# Patient Record
Sex: Female | Born: 1985
Health system: Southern US, Community
[De-identification: ages and names within clinical notes are randomized; demographics above are authoritative.]

## PROBLEM LIST (undated history)

## (undated) DIAGNOSIS — F329 Major depressive disorder, single episode, unspecified: Secondary | ICD-10-CM

## (undated) DIAGNOSIS — R87629 Unspecified abnormal cytological findings in specimens from vagina: Secondary | ICD-10-CM

## (undated) DIAGNOSIS — Z8719 Personal history of other diseases of the digestive system: Secondary | ICD-10-CM

## (undated) DIAGNOSIS — F32A Depression, unspecified: Secondary | ICD-10-CM

## (undated) DIAGNOSIS — J45909 Unspecified asthma, uncomplicated: Secondary | ICD-10-CM

## (undated) HISTORY — PX: TONSILLECTOMY: SUR1361

## (undated) HISTORY — PX: CHOLECYSTECTOMY: SHX55

---

## 2010-08-11 ENCOUNTER — Ambulatory Visit: Payer: Self-pay | Admitting: Family Medicine

## 2010-08-11 DIAGNOSIS — J45909 Unspecified asthma, uncomplicated: Secondary | ICD-10-CM

## 2010-08-11 DIAGNOSIS — F411 Generalized anxiety disorder: Secondary | ICD-10-CM

## 2010-11-15 ENCOUNTER — Ambulatory Visit
Admission: RE | Admit: 2010-11-15 | Discharge: 2010-11-15 | Payer: Self-pay | Source: Home / Self Care | Attending: Family Medicine | Admitting: Family Medicine

## 2010-11-21 NOTE — Assessment & Plan Note (Signed)
Summary: SORE THROAT,WHEEZING, BAD COUGH/JBB   Vital Signs:  Patient Profile:   25 Years Old Female CC:      Sore Throat, Cough. Height:     63 inches Weight:      238 pounds BMI:     42.31 O2 Sat:      100 % O2 treatment:    Room Air Temp:     99.3 degrees F oral Pulse rate:   70 / minute Pulse rhythm:   regular Resp:     20 per minute BP sitting:   119 / 80  (right arm)  Pt. in pain?   yes    Location:   neck    Intensity:   7    Type:       aching  Vitals Entered By: Levonne Spiller EMT-P (August 11, 2010 4:02 PM)              Is Patient Diabetic? No      Current Allergies: ! ZITHROMAX (AZITHROMYCIN) ! SUDAFEDHistory of Present Illness History from: patient Chief Complaint: Sore Throat, Cough. History of Present Illness:  This patient presented today complaining of wheezing, sinus pressure, drainage, postnasal drainage, cough, congestion, headache, pt says it has gotten worse over the last 2 days.  Pt says that she is afraid of getting pancreatitis again,  She has had it annually for the last several years.  Pt says that she is not having any other symptoms at the time.  Pt says that she is not able to take azithromycin because she believes it caused her to have an episode of pancreatitis in the past. She denies CP, SOB, she is using an albuterol inhaler and it seems to help her asthma symptoms, in fact,  she says that her wheezing has resolved now.    REVIEW OF SYSTEMS Constitutional Symptoms       Complains of fever and fatigue.     Denies chills, night sweats, weight loss, and weight gain.  Eyes       Denies change in vision, eye pain, eye discharge, glasses, contact lenses, and eye surgery. Ear/Nose/Throat/Mouth       Complains of ear pain, ear discharge, and hoarseness.      Denies hearing loss/aids, change in hearing, dizziness, frequent runny nose, frequent nose bleeds, sinus problems, sore throat, and tooth pain or bleeding.  Respiratory       Complains of dry  cough, wheezing, and asthma.      Denies productive cough, shortness of breath, bronchitis, and emphysema/COPD.  Cardiovascular       Denies murmurs, chest pain, and tires easily with exhertion.    Gastrointestinal       Denies stomach pain, nausea/vomiting, diarrhea, constipation, blood in bowel movements, and indigestion. Genitourniary       Denies painful urination, kidney stones, and loss of urinary control. Neurological       Complains of headaches.      Denies paralysis, seizures, and fainting/blackouts. Musculoskeletal       Denies muscle pain, joint pain, joint stiffness, decreased range of motion, redness, swelling, muscle weakness, and gout.  Skin       Denies bruising, unusual mles/lumps or sores, and hair/skin or nail changes.  Psych       Denies mood changes, temper/anger issues, anxiety/stress, speech problems, depression, and sleep problems.  Past History:  Family History: Last updated: 08/11/2010 Lung Cancer - Grandfather  - Paternal Skin Cancer - Maternal GF Aunt - Cancer  Social  History: Last updated: 08/11/2010 Occupation: Equities trader Married Alcohol use-no Drug use-no  Past Medical History: history of recurrent pancreatitis Asthma Anxiety Migraine Headaches  Past Surgical History: Cholecystectomy Tonsillectomy  Family History: Lung Cancer - Grandfather  - Paternal Skin Cancer - Maternal GF Aunt - Cancer  Social History: Occupation: Equities trader Married Alcohol use-no Drug use-no Drug Use:  no Physical Exam General appearance: well developed, well nourished, no acute distress Head: normocephalic, atraumatic Eyes: conjunctivae and lids normal Pupils: equal, round, reactive to light Ears: normal, no lesions or deformities Nasal: swollen red turbinates with congestion, thick yellow drainage seen Oral/Pharynx: pharyngeal erythema without exudate, uvula midline without deviation Neck: neck supple,  trachea midline, no  masses Chest/Lungs: no rales, wheezes, or rhonchi bilateral, breath sounds equal without effort Heart: regular rate and  rhythm, no murmur Abdomen: soft, non-tender without obvious organomegaly Extremities: normal extremities Neurological: grossly intact and non-focal Skin: no obvious rashes or lesions MSE: oriented to time, place, and person Assessment New Problems: ACUTE SINUSITIS, UNSPECIFIED (ICD-461.9) ASTHMA, MILD, INTERMITTENT (ICD-493.90) ALLERGIC RHINITIS (ICD-477.9) ANXIETY (ICD-300.00)   Patient Education: Patient and/or caregiver instructed in the following: rest. The risks, benefits and possible side effects were clearly explained and discussed with the patient.  The patient verbalized clear understanding.  The patient was given instructions to return if symptoms don't improve, worsen or new changes develop.  If it is not during clinic hours and the patient cannot get back to this clinic then the patient was told to seek medical care at an available urgent care or emergency department.  The patient verbalized understanding.   Demonstrates willingness to comply.  Plan New Medications/Changes: FLONASE 50 MCG/ACT SUSP (FLUTICASONE PROPIONATE) 2 sprays per nostril once daily  #1 x 0, 08/11/2010, Clanford Johnson MD AMOXICILLIN 400 MG/5ML SUSR (AMOXICILLIN) take 12 mL by mouth two times a day until completed  #120 x 0, 08/11/2010, Clanford Johnson MD  Follow Up: Follow up in 1-2 days if no improvement, Follow up on an as needed basis, Follow up with Primary Physician  The patient and/or caregiver has been counseled thoroughly with regard to medications prescribed including dosage, schedule, interactions, rationale for use, and possible side effects and they verbalize understanding.  Diagnoses and expected course of recovery discussed and will return if not improved as expected or if the condition worsens. Patient and/or caregiver verbalized understanding.  Prescriptions: FLONASE  50 MCG/ACT SUSP (FLUTICASONE PROPIONATE) 2 sprays per nostril once daily  #1 x 0   Entered and Authorized by:   Standley Dakins MD   Signed by:   Standley Dakins MD on 08/11/2010   Method used:   Electronically to        [facility] [address] (retail)       [address], [address]       [place], [place]  [address]       Ph: [phone]       Fax: [phone]   RxID:   [id number] AMOXICILLIN 400 MG/5ML SUSR (AMOXICILLIN) take 12 mL by mouth two times a day until completed  #120 x 0   Entered and Authorized by:   Standley Dakins MD   Signed by:   Standley Dakins MD on 08/11/2010   Method used:   Electronically to        [facility] [address] (retail)       [address], [address]  [place], [place]  [address]       Ph: [phone]       Fax: [phone]   RxID:   [id number]   Patient Instructions: 1)  Take your antibiotic as prescribed until ALL of it is gone, but stop if you develop a rash or swelling and contact our office as soon as possible. 2)  Acute sinusitis symptoms for less than 10 days are not helped by antibiotics.Use warm moist compresses, and over the counter decongestants ( only as directed). Call if no improvement in 5-7 days, sooner if increasing pain, fever, or new symptoms. 3)  Acute bronchitis symptoms for less than 10 days are not helped by antibiotics. take over the counter cough medications. call if no improvment in  5-7 days, sooner if increasing cough, fever, or new symptoms( shortness of breath, chest pain). 4)  The patient was informed that there is no on-call provider or services available at this clinic during off-hours (when the clinic is closed).  If the patient developed a problem or concern that required immediate attention, the patient was advised to go the the nearest available urgent care or emergency department for medical care.  The patient verbalized understanding.    5)   The risks, benefits and possible side effects were clearly explained and discussed with the patient.  The patient verbalized clear understanding.  The patient was given instructions to return if symptoms don't improve, worsen or new changes develop.  If it is not during clinic hours and the patient cannot get back to this clinic then the patient was told to seek medical care at an available urgent care or emergency department.  The patient verbalized understanding.  6)  It was clearly explained to the patient that this [facility] is not intended to be a primary care clinic.  The patient is always better served by the continuity of care and the provider/patient relationships developed with their dedicated primary care provider.  The patient was told to be sure to follow up as soon as possible with their primary care provider to discuss treatments received and to receive further examination and testing.  The patient verbalized understanding. The will f/u with PCP ASAP.

## 2010-11-23 NOTE — Assessment & Plan Note (Signed)
Summary: COLD AND CONGESTION/EVM   Vital Signs:  Patient profile:   25 year old female Height:      62.5 inches Weight:      241 pounds BMI:     43.53 O2 Sat:      100 % on Room air Temp:     99.4 degrees F oral Pulse rate:   70 / minute Pulse rhythm:   regular Resp:     18 per minute BP sitting:   109 / 65  (right arm)  Vitals Entered By: Levonne Spiller EMT-P (November 15, 2010 4:10 PM)  O2 Flow:  Room air CC: URI / Cold Is Patient Diabetic? No Pain Assessment Patient in pain? yes     Location: head Intensity: 7 Type: aching Onset of pain  Gradual  Does patient need assistance? Functional Status Self care Ambulation Normal   Chief Complaint:  URI / Cold.  History of Present Illness: The patient was presenting today because she was having symptoms of fever and sinus pressure for the past several days up to one week and reports that the symptoms have been getting worse. She's been having increasing coughing and wheezing and shortness of breath. She reports that she has been having to use her rescue inhaler more frequently in the past 2 days. She reports that she is coughing up yellow mucus and sputum and having a yellow thick nasal discharge. The patient reports that she has been feeling feverish as well. She has been taking Tylenol over-the-counter. The patient reports that she notices that the wheezing is worse at night and the coughing is worse at night. 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  Allergies: 1)  ! Zithromax (Azithromycin) 2)  ! Sudafed 3)  ! * Relpax  Past History:  Family History: Last updated: 08/11/2010 Lung Cancer - Grandfather  - Paternal Skin Cancer - Maternal GF Aunt - Cancer  Social History: Last updated: 11/15/2010 Occupation: Equities trader at [employer] [address]  Married Alcohol use-no Drug use-no She has a sister that lives in [place], [place].   Past  Medical History: Reviewed history from 08/11/2010 and no changes required. history of recurrent pancreatitis Asthma Anxiety Migraine Headaches  Past Surgical History: Reviewed history from 08/11/2010 and no changes required. Cholecystectomy Tonsillectomy  Family History: Reviewed history from 08/11/2010 and no changes required. 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   Review of Systems General:  Complains of fever; denies chills, fatigue, loss of appetite, malaise, sleep disorder, sweats, weakness, and weight loss. Eyes:  Denies blurring, discharge, double vision, eye irritation, eye pain, halos, itching, light sensitivity, red eye, vision loss-1 eye, and vision loss-both eyes. ENT:  Complains of earache, nasal congestion, postnasal drainage, sinus pressure, and sore throat; denies decreased hearing, difficulty swallowing, ear discharge, hoarseness, nosebleeds, and ringing in ears. CV:  Denies bluish discoloration of lips or nails, chest pain or discomfort, difficulty breathing at night, difficulty breathing while lying down, fainting, fatigue, leg cramps with exertion, lightheadness, near fainting, palpitations, shortness of breath with exertion, swelling of feet, swelling of hands, and weight gain. Resp:  Complains of cough and sputum productive; denies chest discomfort, chest pain with inspiration, coughing up blood, excessive snoring, hypersomnolence,  morning headaches, pleuritic, shortness of breath, and wheezing; Colored Sputum. GI:  Denies abdominal pain, bloody stools, change in bowel habits, constipation, dark tarry stools, diarrhea, excessive appetite, gas, hemorrhoids, indigestion, loss of appetite, nausea, vomiting, vomiting blood, and yellowish skin color. GU:  Denies  abnormal vaginal bleeding, decreased libido, discharge, dysuria, genital sores, hematuria, incontinence, nocturia, urinary frequency, and urinary hesitancy. MS:  Denies joint pain, joint redness, joint swelling, loss of strength, low back pain, mid back pain, muscle aches, muscle , cramps, muscle weakness, stiffness, and thoracic pain. Derm:  Denies changes in color of skin, changes in nail beds, dryness, excessive perspiration, flushing, hair loss, insect bite(s), itching, lesion(s), poor wound healing, and rash. Neuro:  Complains of headaches; denies brief paralysis, difficulty with concentration, disturbances in coordination, falling down, inability to speak, memory loss, numbness, poor balance, seizures, sensation of room spinning, tingling, tremors, visual disturbances, and weakness. Psych:  Denies alternate hallucination ( auditory/visual), anxiety, depression, easily angered, easily tearful, irritability, mental problems, panic attacks, sense of great danger, suicidal thoughts/plans, thoughts of violence, unusual visions or sounds, and thoughts /plans of harming others. Endo:  Denies cold intolerance, excessive hunger, excessive thirst, excessive urination, heat intolerance, polyuria, and weight change. Heme:  Denies abnormal bruising, bleeding, enlarge lymph nodes, fevers, pallor, and skin discoloration. Allergy:  Denies hives or rash, itching eyes, persistent infections, seasonal allergies, and sneezing.  Physical Exam  General:  well developed, well nourished, no acute distress Head:  Normocephalic and atraumatic without obvious abnormalities. No apparent alopecia or balding. Eyes:  No corneal or conjunctival inflammation noted. EOMI. Perrla. Funduscopic exam benign, without hemorrhages, exudates or papilledema. Vision grossly normal. Ears:  External ear exam shows no significant lesions or deformities.  Otoscopic examination reveals clear canals, tympanic membranes are intact bilaterally  without bulging, retraction, inflammation or discharge. Hearing is grossly normal bilaterally. Nose:  external erythema, nasal dischargemucosal pallor, mucosal erythema, mucosal edema, L frontal sinus tenderness, and R frontal sinus tenderness.   Mouth:  good dentition, pharynx pink and moist, and pharyngeal erythema.   Neck:  No deformities, masses, or tenderness noted. Lungs:  normal respiratory effort.   bilateral posterior and anterior expiratory wheezes improved breath sounds after nebulizer treatment Heart:  Normal rate and regular rhythm. S1 and S2 normal without gallop, murmur, click, rub or other extra sounds. Abdomen:  Bowel sounds positive,abdomen soft and non-tender without masses, organomegaly or hernias noted. Neurologic:  No cranial nerve deficits noted. Station and gait are normal. Plantar reflexes are down-going bilaterally. DTRs are symmetrical throughout. Sensory, motor and coordinative functions appear intact. Cervical Nodes:  No lymphadenopathy noted Psych:  Cognition and judgment appear intact. Alert and cooperative with normal attention span and concentration. No apparent delusions, illusions, hallucinations   Impression & Recommendations:  Problem # 1:  ACUTE BRONCHITIS (ICD-466.0)  Her updated medication list for this problem includes:    Guiatuss Ac 100-10 Mg/41ml Syrp (Guaifenesin-codeine) .Marland Kitchen... Take 1 teaspoon by mouth every 6 hours as needed severe cough: caution will cause drowsiness    Ventolin Hfa 108 (90 Base) Mcg/act Aers (Albuterol sulfate) .Marland Kitchen... 2 puffs every 3 hours as needed for wheezing, cough, sob    Amoxicillin 400 Mg/62ml Susr (Amoxicillin) .Marland Kitchen... Take 12 ml by mouth two times a day until completed  Problem # 2:  ASTHMA, MILD, INTERMITTENT (ICD-493.90) Assessment: Deteriorated  Her updated medication list for this problem includes:    Ventolin Hfa 108 (90 Base) Mcg/act Aers (Albuterol sulfate) .Marland Kitchen... 2 puffs every 3 hours as needed  for wheezing, cough,  sob  Problem # 3:  ACUTE SINUSITIS, UNSPECIFIED (ICD-461.9)  Her updated medication list for this problem includes:    Guiatuss Ac 100-10 Mg/28ml Syrp (Guaifenesin-codeine) .Marland Kitchen... Take 1 teaspoon by mouth every 6 hours as needed severe cough: caution will cause drowsiness    Amoxicillin 400 Mg/30ml Susr (Amoxicillin) .Marland Kitchen... Take 12 ml by mouth two times a day until completed    Fluticasone Propionate 50 Mcg/act Susp (Fluticasone propionate) .Marland Kitchen... 2 sprays per nostril once daily   The patient is going to try using Afrin nasal spray to relieve some of the severe nasal congestion but was advised that she can only use it for up to 3 days max. The patient verbalized understanding.  Complete Medication List: 1)  Celexa 40 Mg Tabs (Citalopram hydrobromide) 2)  Ibuprofen 600 Mg Tabs (Ibuprofen) 3)  Ultram 50 Mg Tabs (Tramadol hcl) 4)  Relpax 40 Mg Tabs (Eletriptan hydrobromide) .... As needed 5)  Guiatuss Ac 100-10 Mg/12ml Syrp (Guaifenesin-codeine) .... Take 1 teaspoon by mouth every 6 hours as needed severe cough: caution will cause drowsiness 6)  Ventolin Hfa 108 (90 Base) Mcg/act Aers (Albuterol sulfate) .... 2 puffs every 3 hours as needed for wheezing, cough, sob 7)  Amoxicillin 400 Mg/74ml Susr (Amoxicillin) .... Take 12 ml by mouth two times a day until completed 8)  Fluticasone Propionate 50 Mcg/act Susp (Fluticasone propionate) .... 2 sprays per nostril once daily  Patient Instructions: 1)  Go to the pharmacy and pick up your prescription (s).  It may take up to 30 mins for electronic prescriptions to be delivered to the pharmacy.  Please call if your pharmacy has not received your prescriptions after 30 minutes.   2)  Take your antibiotic as prescribed until ALL of it is gone, but stop if you develop a rash or swelling and contact our office as soon as possible. 3)  Acute sinusitis symptoms for less than 10 days are not helped by antibiotics.Use warm moist compresses, and over the counter  decongestants ( only as directed). Call if no improvement in 5-7 days, sooner if increasing pain, fever, or new symptoms. 4)  Acute bronchitis symptoms for less than 10 days are not helped by antibiotics. take over the counter cough medications. call if no improvment in  5-7 days, sooner if increasing cough, fever, or new symptoms( shortness of breath, chest pain). 5)  The patient was informed that there is no on-call provider or services available at this clinic during off-hours (when the clinic is closed).  If the patient developed a problem or concern that required immediate attention, the patient was advised to go the the nearest available urgent care or emergency department for medical care.  The patient verbalized understanding.    6)  Return or go to the ER if no improvement or symptoms getting worse.   7)  The risks, benefits and possible side effects were clearly explained and discussed with the patient.  The patient verbalized clear understanding.  The patient was given instructions to return if symptoms don't improve, worsen or new changes develop.  If it is not during clinic hours and the patient cannot get back to this clinic then the patient was told to seek medical care at an available urgent care or emergency department.  The patient verbalized understanding.   Prescriptions: FLUTICASONE PROPIONATE 50 MCG/ACT SUSP (FLUTICASONE PROPIONATE) 2 sprays per nostril once daily  #1 x 1   Entered and Authorized by:   Standley Dakins MD  Signed by:   Standley Dakins MD on 11/15/2010   Method used:   Electronically to        [facility] [address] (retail)       [address], [address]       [place], [place]  [address]       Ph: [phone]       Fax: [phone]   RxID:   [id number] AMOXICILLIN 400 MG/5ML SUSR (AMOXICILLIN) take 12 ml by mouth two times a day until completed  #250 mL x 0   Entered and Authorized by:   Standley Dakins MD   Signed by:    Standley Dakins MD on 11/15/2010   Method used:   Electronically to        [facility] [address] (retail)       [address], [address]       [place], [place]  [address]       Ph: [phone]       Fax: [phone]   RxID:   [id number] VENTOLIN HFA 108 (90 BASE) MCG/ACT AERS (ALBUTEROL SULFATE) 2 puffs every 3 hours as needed for wheezing, cough, SOB  #1 x 2   Entered and Authorized by:   Standley Dakins MD   Signed by:   Standley Dakins MD on 11/15/2010   Method used:   Electronically to        [facility] [address] (retail)       [address], [address]       [place], [place]  [address]       Ph: [phone]       Fax: [phone]   RxID:   [id number] GUIATUSS AC 100-10 MG/5ML SYRP (GUAIFENESIN-CODEINE) take 1 teaspoon by mouth every 6 hours as needed severe cough: Caution Will Cause Drowsiness  #70 mL x 0   Entered and Authorized by:   Standley Dakins MD   Signed by:   Standley Dakins MD on 11/15/2010   Method used:   Print then Give to Patient   RxID:   [id number]    Medication Administration  Medication # 1:    Medication: Ipratropium inhalation sol.0.5mg  unit dose    Diagnosis: ASTHMA, MILD, INTERMITTENT (ICD-493.90)    Dose: 1    Route: inhaled    Exp Date: 02/19/2011    Lot #: [id number]    Mfr: SANDOZ    Patient tolerated medication without complications    Given by: Levonne Spiller EMT-P (November 15, 2010 4:46 PM)  Medication # 2:    Medication: Albuterol Sulfate Sol 3mg  unit dose    Diagnosis: ASTHMA, MILD, INTERMITTENT (ICD-493.90)    Dose: 1    Route: inhaled    Exp Date: 02/19/2011    Lot #: [id number]    Mfr: SANDOZ    Patient tolerated medication without complications    Given by: Levonne Spiller EMT-P (November 15, 2010 4:47 PM)

## 2010-12-12 ENCOUNTER — Emergency Department: Payer: Self-pay | Admitting: Internal Medicine

## 2011-04-04 ENCOUNTER — Emergency Department (HOSPITAL_COMMUNITY)
Admission: EM | Admit: 2011-04-04 | Discharge: 2011-04-04 | Disposition: A | Discharge status: Home / Self Care | Payer: 59 | Attending: Emergency Medicine | Admitting: Emergency Medicine

## 2011-04-04 DIAGNOSIS — O99891 Other specified diseases and conditions complicating pregnancy: Secondary | ICD-10-CM | POA: Insufficient documentation

## 2011-04-04 DIAGNOSIS — R109 Unspecified abdominal pain: Secondary | ICD-10-CM | POA: Insufficient documentation

## 2011-04-04 DIAGNOSIS — R55 Syncope and collapse: Secondary | ICD-10-CM | POA: Insufficient documentation

## 2011-04-04 DIAGNOSIS — M25519 Pain in unspecified shoulder: Secondary | ICD-10-CM | POA: Insufficient documentation

## 2011-04-04 LAB — POCT I-STAT, CHEM 8
BUN: 5 mg/dL — ABNORMAL LOW (ref 6–23)
Calcium, Ion: 1.19 mmol/L (ref 1.12–1.32)
HCT: 38 % (ref 36.0–46.0)
Hemoglobin: 12.9 g/dL (ref 12.0–15.0)
TCO2: 23 mmol/L (ref 0–100)

## 2011-07-03 ENCOUNTER — Ambulatory Visit: Payer: Self-pay | Admitting: Obstetrics and Gynecology

## 2011-09-13 ENCOUNTER — Observation Stay: Payer: Self-pay | Admitting: Obstetrics and Gynecology

## 2011-09-30 ENCOUNTER — Inpatient Hospital Stay: Payer: Self-pay

## 2015-11-24 LAB — OB RESULTS CONSOLE HEPATITIS B SURFACE ANTIGEN: Hepatitis B Surface Ag: NEGATIVE

## 2015-11-24 LAB — OB RESULTS CONSOLE HIV ANTIBODY (ROUTINE TESTING): HIV: NONREACTIVE

## 2015-11-24 LAB — OB RESULTS CONSOLE VARICELLA ZOSTER ANTIBODY, IGG: VARICELLA IGG: IMMUNE

## 2015-11-24 LAB — OB RESULTS CONSOLE ABO/RH

## 2015-11-24 LAB — OB RESULTS CONSOLE RUBELLA ANTIBODY, IGM: Rubella: IMMUNE

## 2015-11-24 LAB — OB RESULTS CONSOLE GC/CHLAMYDIA
Chlamydia: NEGATIVE
GC PROBE AMP, GENITAL: NEGATIVE

## 2016-05-23 NOTE — Pre-Procedure Instructions (Signed)
ANESTHESIA CONSULT BY DR Lorin Picket AND PATIENT CAN DELIVER AT [facility]. LEFT MESSAGE FOR NANCY AT [facility]

## 2016-05-25 ENCOUNTER — Inpatient Hospital Stay: Admission: RE | Admit: 2016-05-25 | Payer: Self-pay | Source: Ambulatory Visit

## 2016-06-04 ENCOUNTER — Observation Stay
Admission: RE | Admit: 2016-06-04 | Discharge: 2016-06-04 | Disposition: A | Discharge status: Home / Self Care | Payer: 59 | Source: Ambulatory Visit | Attending: Obstetrics & Gynecology | Admitting: Obstetrics & Gynecology

## 2016-06-04 DIAGNOSIS — R51 Headache: Secondary | ICD-10-CM | POA: Diagnosis not present

## 2016-06-04 DIAGNOSIS — Z3A00 Weeks of gestation of pregnancy not specified: Secondary | ICD-10-CM | POA: Insufficient documentation

## 2016-06-04 DIAGNOSIS — O139 Gestational [pregnancy-induced] hypertension without significant proteinuria, unspecified trimester: Secondary | ICD-10-CM | POA: Diagnosis present

## 2016-06-04 HISTORY — DX: Unspecified asthma, uncomplicated: J45.909

## 2016-06-04 LAB — COMPREHENSIVE METABOLIC PANEL
ALBUMIN: 3 g/dL — AB (ref 3.5–5.0)
ALK PHOS: 89 U/L (ref 38–126)
ALT: 16 U/L (ref 14–54)
AST: 17 U/L (ref 15–41)
Anion gap: 7 (ref 5–15)
BUN: 7 mg/dL (ref 6–20)
CALCIUM: 8.4 mg/dL — AB (ref 8.9–10.3)
CO2: 22 mmol/L (ref 22–32)
CREATININE: 0.55 mg/dL (ref 0.44–1.00)
Chloride: 107 mmol/L (ref 101–111)
GFR calc Af Amer: >60 mL/min (ref >60)
GFR calc non Af Amer: >60 mL/min (ref >60)
GLUCOSE: 81 mg/dL (ref 65–99)
Potassium: 3.4 mmol/L — ABNORMAL LOW (ref 3.5–5.1)
SODIUM: 136 mmol/L (ref 135–145)
Total Bilirubin: 1.2 mg/dL (ref 0.3–1.2)
Total Protein: 6.3 g/dL — ABNORMAL LOW (ref 6.5–8.1)

## 2016-06-04 LAB — CBC
HCT: 38.9 % (ref 35.0–47.0)
HEMOGLOBIN: 13.4 g/dL (ref 12.0–16.0)
MCH: 30.4 pg (ref 26.0–34.0)
MCHC: 34.4 g/dL (ref 32.0–36.0)
MCV: 88.4 fL (ref 80.0–100.0)
PLATELETS: 211 10*3/uL (ref 150–440)
RBC: 4.4 MIL/uL (ref 3.80–5.20)
RDW: 14.3 % (ref 11.5–14.5)
WBC: 11.1 10*3/uL — ABNORMAL HIGH (ref 3.6–11.0)

## 2016-06-04 LAB — PROTEIN / CREATININE RATIO, URINE
CREATININE, URINE: 302 mg/dL
Protein Creatinine Ratio: 0.13 mg/mg{Cre} (ref 0.00–0.15)
Total Protein, Urine: 40 mg/dL

## 2016-06-04 MED ORDER — HYDRALAZINE HCL 20 MG/ML IJ SOLN: 10.0000 mg | Freq: Once | INTRAMUSCULAR | Status: DC | PRN

## 2016-06-04 MED ORDER — LABETALOL HCL 5 MG/ML IV SOLN: 20.0000 mg | INTRAVENOUS | Status: DC | PRN

## 2016-06-04 NOTE — Final Progress Note (Signed)
Physician Final Progress Note  Patient ID: Mary Campos MRN: [id number] DOB/AGE: Feb 29, 1986 30 y.o.  Admit date: 06/04/2016 Admitting provider: Nadara Mustardobert P Samar Venneman, MD Discharge date: 06/04/2016   Admission Diagnoses: Third trimester, Hypertension  Discharge Diagnoses:  Principal Problem:   Gestational hypertension  Significant Findings/ Diagnostic Studies: labs: see PIH labs, all normal  Procedures: A NST procedure was performed with FHR monitoring and a normal baseline established, appropriate time of 20-40 minutes of evaluation, and accels >2 seen w 15x15 characteristics.  Results show a REACTIVE NST.   Discharge Condition: good  Disposition: AF, BPs mostly 90-110/50-70s Chest clear Heart reg Abd gravid NT Extr tr edema  A/P: Stress Induced HTN and Headache Fluids, rest, Tylenol, no s/sx preclampsia  Diet: Regular diet  Discharge Activity: Activity as tolerated     Medication List    TAKE these medications   multivitamin-prenatal 27-0.8 MG Tabs tablet Take 1 tablet by mouth daily at 12 noon.      Total time spent taking care of this patient: 15 minutes  Signed: Letitia Libraobert [name] Gloristine Turrubiates 06/04/2016, 8:16 PM

## 2016-06-04 NOTE — Discharge Instructions (Signed)

## 2016-06-04 NOTE — Discharge Summary (Signed)
  See FPN 

## 2016-07-02 ENCOUNTER — Encounter
Admission: RE | Admit: 2016-07-02 | Discharge: 2016-07-02 | Disposition: A | Discharge status: Home / Self Care | Payer: 59 | Source: Ambulatory Visit | Attending: Obstetrics & Gynecology | Admitting: Obstetrics & Gynecology

## 2016-07-02 LAB — DIFFERENTIAL
BASOS ABS: 0 10*3/uL (ref 0–0.1)
Basophils Relative: 1 %
EOS PCT: 1 %
Eosinophils Absolute: 0 10*3/uL (ref 0–0.7)
LYMPHS ABS: 1.6 10*3/uL (ref 1.0–3.6)
LYMPHS PCT: 21 %
Monocytes Absolute: 0.5 10*3/uL (ref 0.2–0.9)
Monocytes Relative: 6 %
NEUTROS PCT: 71 %
Neutro Abs: 5.6 10*3/uL (ref 1.4–6.5)

## 2016-07-02 LAB — CBC
HCT: 35.7 % (ref 35.0–47.0)
HEMOGLOBIN: 12.5 g/dL (ref 12.0–16.0)
MCH: 30.5 pg (ref 26.0–34.0)
MCHC: 34.9 g/dL (ref 32.0–36.0)
MCV: 87.4 fL (ref 80.0–100.0)
Platelets: 243 10*3/uL (ref 150–440)
RBC: 4.09 MIL/uL (ref 3.80–5.20)
RDW: 14.1 % (ref 11.5–14.5)
WBC: 7.8 10*3/uL (ref 3.6–11.0)

## 2016-07-02 LAB — RAPID HIV SCREEN (HIV 1/2 AB+AG)
HIV 1/2 ANTIBODIES: NONREACTIVE
HIV-1 P24 ANTIGEN - HIV24: NONREACTIVE

## 2016-07-02 LAB — TYPE AND SCREEN
ABO/RH(D): A POS
ANTIBODY SCREEN: NEGATIVE
EXTEND SAMPLE REASON: UNDETERMINED

## 2016-07-02 NOTE — Patient Instructions (Signed)
Your procedure is scheduled on: 07/03/16 Report to Emergency Dept at 0530 am they will escort you to [facility] .  Remember: Instructions that are not followed completely may result in serious medical risk, up to and including death, or upon the discretion of your surgeon and anesthesiologist your surgery may need to be rescheduled.    __X__ 1. Do not eat food or drink liquids after midnight. No gum chewing or hard candies.     __X__ 2. No Alcohol for 24 hours before or after surgery.   ____ 3. Bring all medications with you on the day of surgery if instructed.    __X__ 4. Notify your doctor if there is any change in your medical condition     (cold, fever, infections).     Do not wear jewelry, make-up, hairpins, clips or nail polish.  Do not wear lotions, powders, or perfumes.   Do not shave 48 hours prior to surgery. Men may shave face and neck.  Do not bring valuables to the hospital.    [facility] any belongings or valuables.               Contacts, dentures or bridgework may not be worn into surgery.  Leave your suitcase in the car. After surgery it may be brought to your room.  For patients admitted to the hospital, discharge time is determined by your                treatment team.   Patients discharged the day of surgery will not be allowed to drive home.   Please read over the following fact sheets that you were given:   Surgical Site Infection Prevention   ____ Take these medicines the morning of surgery with A SIP OF WATER:    1. none  2.   3.   4.  5.  6.  ____ Fleet Enema (as directed)   ____ Use CHG Soap as directed  ____ Use inhalers on the day of surgery  ____ Stop metformin 2 days prior to surgery    ____ Take 1/2 of usual insulin dose the night before surgery and none on the morning of surgery.   ____ Stop Coumadin/Plavix/aspirin on   ____ Stop Anti-inflammatories on    ____ Stop supplements until after surgery.    ____  Bring C-Pap to the hospital.

## 2016-07-03 ENCOUNTER — Encounter
Admission: RE | Disposition: A | Discharge status: Home / Self Care | Payer: Self-pay | Source: Ambulatory Visit | Attending: Obstetrics & Gynecology

## 2016-07-03 ENCOUNTER — Inpatient Hospital Stay
Admission: RE | Admit: 2016-07-03 | Discharge: 2016-07-05 | DRG: 765 | Disposition: A | Discharge status: Home / Self Care | Payer: 59 | Source: Ambulatory Visit | Attending: Obstetrics and Gynecology | Admitting: Obstetrics and Gynecology

## 2016-07-03 ENCOUNTER — Inpatient Hospital Stay: Payer: 59 | Admitting: Anesthesiology

## 2016-07-03 DIAGNOSIS — J45909 Unspecified asthma, uncomplicated: Secondary | ICD-10-CM | POA: Diagnosis present

## 2016-07-03 DIAGNOSIS — Z3A39 39 weeks gestation of pregnancy: Secondary | ICD-10-CM

## 2016-07-03 DIAGNOSIS — Z6841 Body Mass Index (BMI) 40.0 and over, adult: Secondary | ICD-10-CM

## 2016-07-03 DIAGNOSIS — O9952 Diseases of the respiratory system complicating childbirth: Secondary | ICD-10-CM | POA: Diagnosis present

## 2016-07-03 DIAGNOSIS — O34219 Maternal care for unspecified type scar from previous cesarean delivery: Principal | ICD-10-CM | POA: Diagnosis present

## 2016-07-03 DIAGNOSIS — O99214 Obesity complicating childbirth: Secondary | ICD-10-CM | POA: Diagnosis present

## 2016-07-03 DIAGNOSIS — Z98891 History of uterine scar from previous surgery: Secondary | ICD-10-CM

## 2016-07-03 LAB — RPR: RPR: NONREACTIVE

## 2016-07-03 SURGERY — Surgical Case
Anesthesia: Regional | Laterality: Bilateral | Wound class: Clean Contaminated

## 2016-07-03 MED ORDER — ONDANSETRON 4 MG PO TBDP
4.0000 mg | ORAL_TABLET | Freq: Four times a day (QID) | ORAL | Status: DC | PRN
  Administered 2016-07-04 (×2): 4 mg via ORAL
  Filled 2016-07-03 (×2): qty 1

## 2016-07-03 MED ORDER — FAMOTIDINE 20 MG PO TABS
20.0000 mg | ORAL_TABLET | Freq: Two times a day (BID) | ORAL | Status: DC
  Administered 2016-07-03 – 2016-07-05 (×4): 20 mg via ORAL
  Filled 2016-07-03 (×4): qty 1

## 2016-07-03 MED ORDER — BUPIVACAINE IN DEXTROSE 0.75-8.25 % IT SOLN
INTRATHECAL | Status: DC | PRN
  Administered 2016-07-03: 13 mL via INTRATHECAL

## 2016-07-03 MED ORDER — OXYCODONE-ACETAMINOPHEN 5-325 MG PO TABS
2.0000 | ORAL_TABLET | ORAL | Status: DC | PRN
  Administered 2016-07-03 (×2): 2 via ORAL
  Administered 2016-07-03: 1 via ORAL
  Administered 2016-07-04 – 2016-07-05 (×3): 2 via ORAL
  Filled 2016-07-03 (×6): qty 2

## 2016-07-03 MED ORDER — DOCUSATE SODIUM 100 MG PO CAPS: 100.0000 mg | ORAL_CAPSULE | Freq: Two times a day (BID) | ORAL | Status: DC | PRN

## 2016-07-03 MED ORDER — SIMETHICONE 80 MG PO CHEW
80.0000 mg | CHEWABLE_TABLET | ORAL | Status: DC | PRN
  Administered 2016-07-03: 80 mg via ORAL
  Filled 2016-07-03: qty 1

## 2016-07-03 MED ORDER — BUPIVACAINE 0.25 % ON-Q PUMP DUAL CATH 400 ML
400.0000 mL | INJECTION | Status: DC
Start: 2016-07-03 — End: 2016-07-05
  Filled 2016-07-03: qty 400

## 2016-07-03 MED ORDER — SIMETHICONE 80 MG PO CHEW
80.0000 mg | CHEWABLE_TABLET | ORAL | Status: DC
  Administered 2016-07-04 (×2): 80 mg via ORAL
  Filled 2016-07-03 (×2): qty 1

## 2016-07-03 MED ORDER — SOD CITRATE-CITRIC ACID 500-334 MG/5ML PO SOLN: 30.0000 mL | ORAL | Status: DC

## 2016-07-03 MED ORDER — MORPHINE SULFATE (PF) 2 MG/ML IV SOLN
1.0000 mg | INTRAVENOUS | Status: DC | PRN
  Administered 2016-07-03: 1 mg via INTRAVENOUS
  Filled 2016-07-03: qty 1

## 2016-07-03 MED ORDER — PRENATAL MULTIVITAMIN CH
1.0000 | ORAL_TABLET | Freq: Every day | ORAL | Status: DC
  Administered 2016-07-04 – 2016-07-05 (×2): 1 via ORAL
  Filled 2016-07-03 (×2): qty 1

## 2016-07-03 MED ORDER — OXYTOCIN 40 UNITS IN LACTATED RINGERS INFUSION - SIMPLE MED
2.5000 [IU]/h | INTRAVENOUS | Status: AC
  Filled 2016-07-03: qty 1000

## 2016-07-03 MED ORDER — PHENYLEPHRINE HCL 10 MG/ML IJ SOLN
INTRAMUSCULAR | Status: DC | PRN
  Administered 2016-07-03 (×6): 100 ug via INTRAVENOUS
  Administered 2016-07-03: 200 ug via INTRAVENOUS
  Administered 2016-07-03 (×2): 100 ug via INTRAVENOUS

## 2016-07-03 MED ORDER — COCONUT OIL OIL: 1.0000 "application " | TOPICAL_OIL | Status: DC | PRN

## 2016-07-03 MED ORDER — KETOROLAC TROMETHAMINE 30 MG/ML IJ SOLN
INTRAMUSCULAR | Status: DC | PRN
  Administered 2016-07-03: 30 mg via INTRAVENOUS

## 2016-07-03 MED ORDER — LACTATED RINGERS IV SOLN
INTRAVENOUS | Status: DC
  Administered 2016-07-03: 08:00:00 via INTRAVENOUS

## 2016-07-03 MED ORDER — BUPIVACAINE HCL 0.25 % IJ SOLN
INTRAMUSCULAR | Status: DC | PRN
  Administered 2016-07-03: 10 mL

## 2016-07-03 MED ORDER — LACTATED RINGERS IV SOLN
Freq: Once | INTRAVENOUS | Status: AC
  Administered 2016-07-03: 07:00:00 via INTRAVENOUS

## 2016-07-03 MED ORDER — BUPIVACAINE HCL 0.5 % IJ SOLN
10.0000 mL | Freq: Once | INTRAMUSCULAR | Status: DC
  Filled 2016-07-03: qty 10

## 2016-07-03 MED ORDER — DIPHENHYDRAMINE HCL 25 MG PO CAPS: 25.0000 mg | ORAL_CAPSULE | Freq: Four times a day (QID) | ORAL | Status: DC | PRN

## 2016-07-03 MED ORDER — MENTHOL 3 MG MT LOZG
1.0000 | LOZENGE | OROMUCOSAL | Status: DC | PRN
  Filled 2016-07-03: qty 9

## 2016-07-03 MED ORDER — ONDANSETRON HCL 4 MG/2ML IJ SOLN
4.0000 mg | Freq: Four times a day (QID) | INTRAMUSCULAR | Status: DC | PRN
  Administered 2016-07-03 (×2): 4 mg via INTRAVENOUS
  Filled 2016-07-03 (×2): qty 2

## 2016-07-03 MED ORDER — SOD CITRATE-CITRIC ACID 500-334 MG/5ML PO SOLN
ORAL | Status: AC
  Administered 2016-07-03: 30 mL
  Filled 2016-07-03: qty 15

## 2016-07-03 MED ORDER — WITCH HAZEL-GLYCERIN EX PADS: 1.0000 "application " | MEDICATED_PAD | CUTANEOUS | Status: DC | PRN

## 2016-07-03 MED ORDER — LACTATED RINGERS IV SOLN
INTRAVENOUS | Status: DC
  Administered 2016-07-04: via INTRAVENOUS

## 2016-07-03 MED ORDER — BUPIVACAINE HCL (PF) 0.5 % IJ SOLN
INTRAMUSCULAR | Status: AC
  Filled 2016-07-03: qty 30

## 2016-07-03 MED ORDER — SIMETHICONE 80 MG PO CHEW
80.0000 mg | CHEWABLE_TABLET | Freq: Three times a day (TID) | ORAL | Status: DC
  Administered 2016-07-03 – 2016-07-05 (×5): 80 mg via ORAL
  Filled 2016-07-03 (×5): qty 1

## 2016-07-03 MED ORDER — CEFOXITIN SODIUM-DEXTROSE 2-2.2 GM-% IV SOLR (PREMIX): 2.0000 g | INTRAVENOUS | Status: DC

## 2016-07-03 MED ORDER — FENTANYL CITRATE (PF) 100 MCG/2ML IJ SOLN
25.0000 ug | INTRAMUSCULAR | Status: DC | PRN
  Administered 2016-07-03: 20 ug via INTRAVENOUS

## 2016-07-03 MED ORDER — KETOROLAC TROMETHAMINE 30 MG/ML IJ SOLN
30.0000 mg | Freq: Four times a day (QID) | INTRAMUSCULAR | Status: AC
  Administered 2016-07-03 – 2016-07-04 (×3): 30 mg via INTRAVENOUS
  Filled 2016-07-03 (×3): qty 1

## 2016-07-03 MED ORDER — OXYTOCIN 40 UNITS IN LACTATED RINGERS INFUSION - SIMPLE MED
INTRAVENOUS | Status: DC | PRN
  Administered 2016-07-03: 999 mL via INTRAVENOUS
  Administered 2016-07-03: 1 mL via INTRAVENOUS

## 2016-07-03 MED ORDER — OXYTOCIN 40 UNITS IN LACTATED RINGERS INFUSION - SIMPLE MED
INTRAVENOUS | Status: AC
  Filled 2016-07-03: qty 1000

## 2016-07-03 MED ORDER — SENNOSIDES-DOCUSATE SODIUM 8.6-50 MG PO TABS
2.0000 | ORAL_TABLET | ORAL | Status: DC
  Administered 2016-07-04: 2 via ORAL
  Filled 2016-07-03: qty 2

## 2016-07-03 MED ORDER — ACETAMINOPHEN 325 MG PO TABS: 650.0000 mg | ORAL_TABLET | ORAL | Status: DC | PRN

## 2016-07-03 MED ORDER — ONDANSETRON HCL 4 MG/2ML IJ SOLN
4.0000 mg | Freq: Once | INTRAMUSCULAR | Status: AC | PRN
  Administered 2016-07-03: 4 mg via INTRAVENOUS

## 2016-07-03 MED ORDER — OXYCODONE-ACETAMINOPHEN 5-325 MG PO TABS
1.0000 | ORAL_TABLET | ORAL | Status: DC | PRN
  Administered 2016-07-04 – 2016-07-05 (×4): 1 via ORAL
  Filled 2016-07-03 (×4): qty 1

## 2016-07-03 MED ORDER — CEFOXITIN SODIUM-DEXTROSE 2-2.2 GM-% IV SOLR (PREMIX)
INTRAVENOUS | Status: AC
  Administered 2016-07-03: 2000 mg
  Filled 2016-07-03: qty 50

## 2016-07-03 MED ORDER — DIBUCAINE 1 % RE OINT: 1.0000 "application " | TOPICAL_OINTMENT | RECTAL | Status: DC | PRN

## 2016-07-03 MED ORDER — ZOLPIDEM TARTRATE 5 MG PO TABS: 5.0000 mg | ORAL_TABLET | Freq: Every evening | ORAL | Status: DC | PRN

## 2016-07-03 SURGICAL SUPPLY — 24 items
BARRIER ADHS 3X4 INTERCEED (GAUZE/BANDAGES/DRESSINGS) ×3 IMPLANT
CANISTER SUCT 3000ML (MISCELLANEOUS) ×3 IMPLANT
CATH KIT ON-Q SILVERSOAK 5IN (CATHETERS) ×6 IMPLANT
CHLORAPREP W/TINT 26ML (MISCELLANEOUS) ×6 IMPLANT
ELECT CAUTERY BLADE 6.4 (BLADE) ×3 IMPLANT
ELECT REM PT RETURN 9FT ADLT (ELECTROSURGICAL) ×3
ELECTRODE REM PT RTRN 9FT ADLT (ELECTROSURGICAL) ×1 IMPLANT
GLOVE SKINSENSE NS SZ8.0 LF (GLOVE) ×8
GLOVE SKINSENSE STRL SZ8.0 LF (GLOVE) ×4 IMPLANT
GOWN STRL REUS W/ TWL LRG LVL3 (GOWN DISPOSABLE) ×1 IMPLANT
GOWN STRL REUS W/ TWL XL LVL3 (GOWN DISPOSABLE) ×2 IMPLANT
GOWN STRL REUS W/TWL LRG LVL3 (GOWN DISPOSABLE) ×2
GOWN STRL REUS W/TWL XL LVL3 (GOWN DISPOSABLE) ×4
LIQUID BAND (GAUZE/BANDAGES/DRESSINGS) ×6 IMPLANT
NS IRRIG 1000ML POUR BTL (IV SOLUTION) ×3 IMPLANT
PACK C SECTION AR (MISCELLANEOUS) ×3 IMPLANT
PAD OB MATERNITY 4.3X12.25 (PERSONAL CARE ITEMS) ×3 IMPLANT
PAD PREP 24X41 OB/GYN DISP (PERSONAL CARE ITEMS) ×3 IMPLANT
RTRCTR C-SECT PINK 34CM XLRG (MISCELLANEOUS) ×3 IMPLANT
SUT MAXON ABS #0 GS21 30IN (SUTURE) ×6 IMPLANT
SUT PLAIN 2 0 XLH (SUTURE) ×3 IMPLANT
SUT VIC AB 1 CT1 36 (SUTURE) ×18 IMPLANT
SUT VIC AB 2-0 CT1 36 (SUTURE) IMPLANT
SUT VIC AB 4-0 FS2 27 (SUTURE) ×3 IMPLANT

## 2016-07-03 NOTE — Transfer of Care (Signed)
Immediate Anesthesia Transfer of Care Note  Patient: Mary GranaJamie Campos  Procedure(s) Performed: Procedure(s): CESAREAN SECTION/ Female 8lbs. 3oz (Bilateral)  Patient Location: PACU  Anesthesia Type:Spinal  Level of Consciousness: awake, alert  and oriented  Airway & Oxygen Therapy: Patient Spontanous Breathing  Post-op Assessment: Report given to RN and Post -op Vital signs reviewed and stable  Post vital signs: Reviewed and stable  Last Vitals:  Vitals:   07/03/16 0620  BP: (!) 85/40  Pulse: 74  Resp: 18  Temp: 37.1 C    Last Pain:  Vitals:   07/03/16 0620  TempSrc: Oral         Complications: No apparent anesthesia complications

## 2016-07-03 NOTE — Anesthesia Procedure Notes (Signed)
Spinal  Start time: 07/03/2016 7:49 AM End time: 07/03/2016 7:51 AM Staffing Resident/CRNA: Almeta MonasFLETCHER, Pacen Watford Performed: resident/CRNA  Preanesthetic Checklist Completed: patient identified, site marked, surgical consent, pre-op evaluation, timeout performed, IV checked, risks and benefits discussed and monitors and equipment checked Spinal Block Patient position: sitting Prep: ChloraPrep Patient monitoring: continuous pulse ox, blood pressure, cardiac monitor and heart rate Approach: midline Location: L3-4 Injection technique: single-shot Needle Needle type: Pencil-Tip  Needle gauge: 25 G Assessment Sensory level: T2

## 2016-07-03 NOTE — Discharge Summary (Signed)
Obstetrical Discharge Summary  Date of Admission: 07/03/2016 Date of Discharge:07/05/16  Discharge Diagnosis: Term Pregnancy-delivered and Repeat Cesarean Primary OB:  [facility]   Gestational Age at Delivery: 3142w2d  Antepartum complications: Obesity, Asthma Date of Delivery: 07/05/2016  Delivered By: Annamarie MajorPaul Riona Lahti, MD Delivery Type: repeat cesarean section, low transverse incision Intrapartum complications/course: None Anesthesia: spinal Placenta: manual removal Laceration: n/a Episiotomy: none Live born female  Birth Weight: 8 lb 2.5 oz (3700 g) APGAR: 8, 9   Post partum course: Since the delivery, patient has tolerate activity, diet, and daily functions without difficulty or complication.  Min lochia.  No breast concerns at this time.  No signs of depression currently.   Postpartum Exam:General appearance: alert, cooperative, no distress and moderately obese GI: soft, non-tender; bowel sounds normal; no masses,  no organomegaly and Fundus firm Extremities: extremities normal, atraumatic, no cyanosis or edema Breast: normal in size and symmetry Inc healing  well, OnQ intact  Disposition: home with infant Rh Immune globulin given: not applicable Rubella vaccine given: not applicable Varicella vaccine given: not applicable Tdap vaccine given in AP or PP setting: not desired, given in past pregnancy 2 years ago Flu vaccine given in AP or PP setting: no Contraception: Depo for now, then consider IUD, Nexplanon  Prenatal Labs: A POS//Rubella Immune//RPR negative//HIV negative/HepB Surface Ag negative//plans to breastfeed  Plan:  Mary Campos was discharged to home in good condition. [facility]  Discharge Medications:   Medication List    TAKE these medications   multivitamin-prenatal 27-0.8 MG Tabs tablet Take 1 tablet by mouth daily at 12 noon.   oxyCODONE-acetaminophen 5-325 MG tablet Commonly known as:  PERCOCET/ROXICET Take 1  tablet by mouth every 4 (four) hours as needed (pain scale 4-7).     Colace twice daily for stool softener Ibuprofen as needed for pain  Follow-up arrangements:  1 week

## 2016-07-03 NOTE — H&P (Signed)
History and Physical Interval Note:  07/03/2016 7:13 AM  Mary GranaJamie Mittman  has presented today for surgery, with the diagnosis of term pregnancy,prior csection. The various methods of treatment have been discussed with the patient and family. After consideration of risks, benefits and other options for treatment, the patient has consented to  Procedure(s): CESAREAN SECTION as a surgical intervention .  The patient's history has been reviewed, patient examined, no change in status, stable for surgery.  Pt has the following beta blocker history-  Not taking Beta Blocker.  I have reviewed the patient's chart and labs.  Questions were answered to the patient's satisfaction.    Letitia Libraobert [name] Keyaan Lederman

## 2016-07-03 NOTE — Anesthesia Preprocedure Evaluation (Signed)
Anesthesia Evaluation  Patient identified by MRN, date of birth, ID band Patient awake    Reviewed: Allergy & Precautions, H&P , NPO status , Patient's Chart, lab work & pertinent test results, reviewed documented beta blocker date and time   Airway Mallampati: III  TM Distance: >3 FB Neck ROM: full    Dental no notable dental hx. (+) Teeth Intact   Pulmonary neg pulmonary ROS, asthma ,    Pulmonary exam normal breath sounds clear to auscultation       Cardiovascular Exercise Tolerance: Good hypertension, On Medications negative cardio ROS Normal cardiovascular exam Rhythm:regular Rate:Normal     Neuro/Psych negative neurological ROS  negative psych ROS   GI/Hepatic negative GI ROS, Neg liver ROS,   Endo/Other  negative endocrine ROSMorbid obesity  Renal/GU negative Renal ROS  negative genitourinary   Musculoskeletal   Abdominal   Peds  Hematology negative hematology ROS (+)   Anesthesia Other Findings   Reproductive/Obstetrics (+) Pregnancy                             Anesthesia Physical Anesthesia Plan  ASA: III  Anesthesia Plan: Regional and Spinal   Post-op Pain Management:    Induction:   Airway Management Planned:   Additional Equipment:   Intra-op Plan:   Post-operative Plan:   Informed Consent: I have reviewed the patients History and Physical, chart, labs and discussed the procedure including the risks, benefits and alternatives for the proposed anesthesia with the patient or authorized representative who has indicated his/her understanding and acceptance.     Plan Discussed with: CRNA  Anesthesia Plan Comments:         Anesthesia Quick Evaluation

## 2016-07-03 NOTE — Op Note (Signed)
Cesarean Section Procedure Note Indications: prior cesarean section and term intrauterine pregnancy  Pre-operative Diagnosis: Intrauterine pregnancy 6016w2d ;  prior cesarean section and term intrauterine pregnancy Post-operative Diagnosis: same, delivered. Procedure: Low Transverse Cesarean Section Surgeon: Annamarie MajorPaul Keon Benscoter, MD, FACOG Assistant(s): Bonney AidStaebler Anesthesia: Spinal anesthesia Estimated Blood Loss:600 mL Complications: None; patient tolerated the procedure well. Disposition: PACU - hemodynamically stable. Condition: stable  Findings: A female infant in the cephalic presentation. Amniotic fluid - Clear  Birth weight 8-3 lbs.  Apgars of 8 and 9.  Intact placenta with a three-vessel cord. Grossly normal uterus, tubes and ovaries bilaterally. Thin lower uterine segment.  Min intraabdominal adhesions were noted.  Procedure Details   The patient was taken to Operating Room, identified as the correct patient and the procedure verified as C-Section Delivery. A Time Out was held and the above information confirmed. After induction of anesthesia, the patient was draped and prepped in the usual sterile manner. A Pfannenstiel incision was made and carried down through the subcutaneous tissue to the fascia. Fascial incision was made and extended transversely with the Mayo scissors. The fascia was separated from the underlying rectus tissue superiorly and inferiorly. The peritoneum was identified and entered bluntly. Peritoneal incision was extended longitudinally. The utero-vesical peritoneal reflection was incised transversely and a bladder flap was created digitally.  A low transverse hysterotomy was made. The fetus was delivered atraumatically. The umbilical cord was clamped x2 and cut and the infant was handed to the awaiting pediatricians. The placenta was removed intact and appeared normal with a 3-vessel cord.  The uterus was exteriorized and cleared of all clot and debris. The hysterotomy was  closed with running sutures of 0 Vicryl suture. A second imbricating layer was placed with the same suture. Excellent hemostasis was observed. The uterus was returned to the abdomen. Interceed was placed over hysterotomy incision.  The pelvis was irrigated and again, excellent hemostasis was noted.  The On Q Pain pump System was then placed.  Trocars were placed through the abdominal wall into the subfascial space and these were used to thread the silver soaker cathaters into place.The rectus fascia was then reapproximated with running sutures of Maxon, with careful placement not to incorporate the cathaters. Subcutaneous tissues are then irrigated with saline and hemostasis assured.  Skin is then closed with 4-0 vicryl suture in a subcuticular fashion followed by skin adhesive. The cathaters are flushed each with 5 mL of Bupivicaine and stabilized into place with dressing. Instrument, sponge, and needle counts were correct prior to the abdominal closure and at the conclusion of the case.  The patient tolerated the procedure well and was transferred to the recovery room in stable condition.

## 2016-07-04 LAB — CBC
HCT: 31.7 % — ABNORMAL LOW (ref 35.0–47.0)
HEMOGLOBIN: 11.2 g/dL — AB (ref 12.0–16.0)
MCH: 30.6 pg (ref 26.0–34.0)
MCHC: 35.3 g/dL (ref 32.0–36.0)
MCV: 86.6 fL (ref 80.0–100.0)
Platelets: 210 10*3/uL (ref 150–440)
RBC: 3.66 MIL/uL — ABNORMAL LOW (ref 3.80–5.20)
RDW: 14.1 % (ref 11.5–14.5)
WBC: 9.8 10*3/uL (ref 3.6–11.0)

## 2016-07-04 NOTE — Anesthesia Postprocedure Evaluation (Signed)
Anesthesia Post Note  Patient: Delsa GranaJamie Noe  Procedure(s) Performed: Procedure(s) (LRB): CESAREAN SECTION/ Female 8lbs. 3oz (Bilateral)  Patient location during evaluation: Mother Baby Anesthesia Type: Spinal Level of consciousness: awake Pain management: pain level controlled Vital Signs Assessment: vitals unstable and post-procedure vital signs reviewed and stable Respiratory status: spontaneous breathing Cardiovascular status: blood pressure returned to baseline and stable Postop Assessment: no headache, no backache, no signs of nausea or vomiting and spinal receding Anesthetic complications: no Comments: Moves feet and legs, catheter just removed, has not stood or voided as of yet.  Site clean, dry, not red    Last Vitals:  Vitals:   07/04/16 0453 07/04/16 0725  BP: (!) 97/57 (!) 100/53  Pulse: (!) 54 62  Resp: 18 18  Temp: 36.9 C 36.7 C    Last Pain:  Vitals:   07/04/16 0725  TempSrc: Oral  PainSc:                  Vernie MurdersPope,  Kimberely Mccannon G

## 2016-07-04 NOTE — Progress Notes (Signed)
  Post-operative Day 1  Subjective: no complaints, voiding and tolerating PO  Objective: Blood pressure (!) 100/53, pulse 62, temperature 98 F (36.7 C), temperature source Oral, resp. rate 18, height 5\' 3"  (1.6 m), weight 280 lb (127 kg), last menstrual period 10/02/2015, SpO2 99 %  Physical Exam:  General: alert and cooperative Lochia: appropriate Uterine Fundus: firm Incision: occlusive dressing  DVT Evaluation: No evidence of DVT seen on physical exam. Abdomen: soft, NT   Recent Labs  07/02/16 1156 07/04/16 0620  HGB 12.5 11.2*  HCT 35.7 31.7*    Assessment POD #1  Plan: Continue PO care, Advance activity as tolerated and Discharge POD 2 or 3  Feeding: breast Contraception: may Depo Blood Type: A+ RI/VI TDAP: needs   Marta AntuBrothers, Kailee Essman, [place] 07/04/2016, 10:03 AM

## 2016-07-05 MED ORDER — MEDROXYPROGESTERONE ACETATE 150 MG/ML IM SUSP: 150.0000 mg | Freq: Once | INTRAMUSCULAR | Status: DC

## 2016-07-05 MED ORDER — MEDROXYPROGESTERONE ACETATE 150 MG/ML IM SUSP
150.0000 mg | Freq: Once | INTRAMUSCULAR | Status: AC
  Administered 2016-07-05: 150 mg via INTRAMUSCULAR
  Filled 2016-07-05: qty 1

## 2016-07-05 MED ORDER — OXYCODONE-ACETAMINOPHEN 5-325 MG PO TABS: 1.0000 | ORAL_TABLET | ORAL | 0 refills | Status: DC | PRN

## 2016-07-05 MED ORDER — MEDROXYPROGESTERONE ACETATE 150 MG/ML IM SUSP: 150.0000 mg | Freq: Once | INTRAMUSCULAR | 0 refills | Status: DC

## 2016-07-05 NOTE — Discharge Instructions (Signed)
Cesarean Delivery, Care After °Refer to this sheet in the next few weeks. These instructions provide you with information on caring for yourself after your procedure. Your health care provider may also give you specific instructions. Your treatment has been planned according to current medical practices, but problems sometimes occur. Call your health care provider if you have any problems or questions after you go home. °HOME CARE INSTRUCTIONS  °· Only take over-the-counter or prescription medications as directed by your health care provider. °· Do not drink alcohol, especially if you are breastfeeding or taking medication to relieve pain. °· Do not chew or smoke tobacco. °· Continue to use good perineal care. Good perineal care includes: °¨ Wiping your perineum from front to back. °¨ Keeping your perineum clean. °· Check your surgical cut (incision) daily for increased redness, drainage, swelling, or separation of skin. °· Clean your incision gently with soap and water every day, and then pat it dry. If your health care provider says it is okay, leave the incision uncovered. Use a bandage (dressing) if the incision is draining fluid or appears irritated. If the adhesive strips across the incision do not fall off within 7 days, carefully peel them off. °· Hug a pillow when coughing or sneezing until your incision is healed. This helps to relieve pain. °· Do not use tampons or douche for the next 6 weeks.  °· Showers only, no tub baths for the next 6 weeks.  °· Wear a well-fitting bra that provides breast support. °· Limit wearing support panties or control-top hose. °· Drink enough fluids to keep your urine clear or pale yellow. °· Eat high-fiber foods such as whole grain cereals and breads, brown rice, beans, and fresh fruits and vegetables every day. These foods may help prevent or relieve constipation. °· Resume activities such as climbing stairs, driving, lifting, exercising, or traveling as directed by your  health care provider. °· No sexual intercourse for at least the next 6 weeks, and then not until you feel ready.  °· Try to have someone help you with your household activities and your newborn for at least a few days after you leave the hospital. °· Rest as much as possible. Try to rest or take a nap when your newborn is sleeping. °· Increase your activities gradually. °· Keep all of your scheduled postpartum appointments. It is very important to keep your scheduled follow-up appointments. At these appointments, your health care provider will be checking to make sure that you are healing physically and emotionally. °SEEK MEDICAL CARE IF:  °· You are passing large clots from your vagina. Save any clots to show your health care provider. °· You have a foul smelling discharge from your vagina. °· You have trouble urinating. °· You are urinating frequently. °· You have pain when you urinate. °· You have a change in your bowel movements. °· You have increasing redness, pain, or swelling near your incision. °· You have pus draining from your incision. °· Your incision is separating. °· You have painful, hard, or reddened breasts. °· You have a severe headache. °· You have blurred vision or see spots. °· You feel sad or depressed. °· You have thoughts of hurting yourself or your newborn. °· You have questions about your care, the care of your newborn, or medications. °· You are dizzy or light-headed. °· You have a rash. °· You have pain, redness, or swelling at the site of the removed intravenous access (IV) tube. °· You have nausea or   vomiting. °· You stopped breastfeeding and have not had a menstrual period within 12 weeks of stopping. °· You are not breastfeeding and have not had a menstrual period within 12 weeks of delivery. °· You have a fever. °SEEK IMMEDIATE MEDICAL CARE IF: °· You have persistent pain. °· You have chest pain. °· You have shortness of breath. °· You faint. °· You have leg pain. °· You have stomach  pain. °· Your vaginal bleeding saturates 2 or more sanitary pads in 1 hour. °MAKE SURE YOU:  °· Understand these instructions. °· Will watch your condition. °· Will get help right away if you are not doing well or get worse. °  °This information is not intended to replace advice given to you by your health care provider. Make sure you discuss any questions you have with your health care provider. °  °Document Released: 06/30/2002 Document Revised: 10/29/2014 Document Reviewed: 06/04/2012 °Elsevier Interactive Patient Education ©2016 [employer] ° °

## 2016-07-05 NOTE — Progress Notes (Signed)
Patient discharged home with infant. Vital signs stable, bleeding within normal limits, uterus firm. Discharge instructions, prescriptions, and follow up appointment given to and reviewed with patient. Patient verbalized understanding, all questions answered. Escorted in wheelchair by auxiliary.    Larysa Pall, RN  

## 2017-01-17 ENCOUNTER — Encounter: Payer: Self-pay | Admitting: Advanced Practice Midwife

## 2017-01-17 ENCOUNTER — Other Ambulatory Visit: Payer: Self-pay | Admitting: Advanced Practice Midwife

## 2017-01-17 ENCOUNTER — Other Ambulatory Visit (INDEPENDENT_AMBULATORY_CARE_PROVIDER_SITE_OTHER): Payer: 59

## 2017-01-17 ENCOUNTER — Ambulatory Visit (INDEPENDENT_AMBULATORY_CARE_PROVIDER_SITE_OTHER): Payer: 59 | Admitting: Advanced Practice Midwife

## 2017-01-17 ENCOUNTER — Ambulatory Visit: Payer: Self-pay | Admitting: Obstetrics and Gynecology

## 2017-01-17 VITALS — BP 112/68 | HR 67 | Ht 63.0 in | Wt 255.0 lb

## 2017-01-17 DIAGNOSIS — N921 Excessive and frequent menstruation with irregular cycle: Secondary | ICD-10-CM | POA: Diagnosis not present

## 2017-01-17 DIAGNOSIS — N939 Abnormal uterine and vaginal bleeding, unspecified: Secondary | ICD-10-CM | POA: Diagnosis not present

## 2017-01-17 LAB — POCT URINE PREGNANCY: PREG TEST UR: NEGATIVE

## 2017-01-17 MED ORDER — METHYLERGONOVINE MALEATE 0.2 MG PO TABS: 0.2000 mg | ORAL_TABLET | Freq: Four times a day (QID) | ORAL | 1 refills | Status: AC | PRN

## 2017-01-17 NOTE — Progress Notes (Signed)
  HPI:      Ms. Mary Campos is a 31 y.o. [phone] who LMP was No LMP recorded., presents today for a problem visit.  She complains of menometrorrhagia that  began about a month ago and its severity is described as moderate.  She is 7 months postpartum and has been weaning her baby for the past few weeks. She has not breastfed at all in 9 days. Her bleeding has been on and off over the past 6 weeks occasionally stopping, sometimes heavy and sometimes spotting. Today she has had severe cramps with the bleeding. She did have one injection of depo following her delivery but then did not follow up with the next dose. She prefers not to take hormones. She is wondering if she has fibroids or ovarian cyst.   Previous evaluation: none. Prior Diagnosis: none. Previous Treatment: NSAID ibuprofen.  She is sexually active Contraception: bilateral tubal ligation Hx of STDs: none. She is premenopausal.  PMHx: She  has a past medical history of Asthma. Also,  has a past surgical history that includes Tonsillectomy; Cesarean section; and Cesarean section with bilateral tubal ligation (Bilateral, 07/03/2016)., family history is not on file.,  reports that she has never smoked. She has never used smokeless tobacco. She reports that she does not drink alcohol or use drugs.  She has a current medication list which includes the following prescription(s): albuterol and methylergonovine. Also, is allergic to azithromycin; eletriptan hydrobromide; pseudoephedrine; and tape.  ROS: negative except as noted on HPI  Objective: BP 112/68 (BP Location: Left Arm, Patient Position: Sitting, Cuff Size: Large)   Pulse 67   Ht 5\' 3"  (1.6 m)   Wt 255 lb (115.7 kg)   Breastfeeding? No   BMI 45.17 kg/m  OBGyn Exam  ASSESSMENT/PLAN:  menometrorrhagia  Transvaginal U/S today with follow up. Will discuss with Dr Jean RosenthalJackson for diagnosis and then discuss with patient for continuing plan.    No problem-specific Assessment & Plan  notes found for this encounter.  Problem List Items Addressed This Visit    None    Visit Diagnoses    Menorrhagia with irregular cycle    -  Primary   Relevant Medications   methylergonovine (METHERGINE) 0.2 MG tablet   Other Relevant Orders   POCT urine pregnancy (Completed)      Tresea MallJane Kataleia Quaranta, CNM

## 2017-01-22 ENCOUNTER — Telehealth: Payer: Self-pay | Admitting: Advanced Practice Midwife

## 2017-01-22 NOTE — Telephone Encounter (Signed)
Nothing concerning on ultrasound but there is a possible explanation really needs MD follow up to sit down and discuss management options

## 2017-01-22 NOTE — Telephone Encounter (Signed)
Please look at U/S, or talk to JEG,

## 2017-01-22 NOTE — Telephone Encounter (Signed)
Please schedule accordingly.  Thanks

## 2017-01-22 NOTE — Telephone Encounter (Signed)
Pt is calling about her Ultrasound. Pt states she she is still waiting to hear from another provider about her Ultrasound not being able to be read. Please call pt.

## 2017-01-28 ENCOUNTER — Emergency Department
Admission: EM | Admit: 2017-01-28 | Discharge: 2017-01-28 | Disposition: A | Discharge status: Home / Self Care | Payer: 59 | Attending: Student in an Organized Health Care Education/Training Program | Admitting: Student in an Organized Health Care Education/Training Program

## 2017-01-28 ENCOUNTER — Encounter: Payer: Self-pay | Admitting: Emergency Medicine

## 2017-01-28 DIAGNOSIS — N939 Abnormal uterine and vaginal bleeding, unspecified: Secondary | ICD-10-CM | POA: Diagnosis present

## 2017-01-28 DIAGNOSIS — J45909 Unspecified asthma, uncomplicated: Secondary | ICD-10-CM | POA: Insufficient documentation

## 2017-01-28 LAB — URINALYSIS, COMPLETE (UACMP) WITH MICROSCOPIC
BACTERIA UA: NONE SEEN
BILIRUBIN URINE: NEGATIVE
GLUCOSE, UA: NEGATIVE mg/dL
KETONES UR: NEGATIVE mg/dL
LEUKOCYTES UA: NEGATIVE
Nitrite: NEGATIVE
PROTEIN: NEGATIVE mg/dL
Specific Gravity, Urine: 1.023 (ref 1.005–1.030)
pH: 6 (ref 5.0–8.0)

## 2017-01-28 LAB — BASIC METABOLIC PANEL
Anion gap: 7 (ref 5–15)
BUN: 12 mg/dL (ref 6–20)
CO2: 26 mmol/L (ref 22–32)
Calcium: 9.3 mg/dL (ref 8.9–10.3)
Chloride: 107 mmol/L (ref 101–111)
Creatinine, Ser: 0.89 mg/dL (ref 0.44–1.00)
GFR calc Af Amer: >60 mL/min (ref >60)
GFR calc non Af Amer: >60 mL/min (ref >60)
Glucose, Bld: 90 mg/dL (ref 65–99)
Potassium: 3.4 mmol/L — ABNORMAL LOW (ref 3.5–5.1)
Sodium: 140 mmol/L (ref 135–145)

## 2017-01-28 LAB — CBC
HCT: 40.8 % (ref 35.0–47.0)
HEMOGLOBIN: 14 g/dL (ref 12.0–16.0)
MCH: 28.7 pg (ref 26.0–34.0)
MCHC: 34.3 g/dL (ref 32.0–36.0)
MCV: 83.7 fL (ref 80.0–100.0)
PLATELETS: 341 10*3/uL (ref 150–440)
RBC: 4.87 MIL/uL (ref 3.80–5.20)
RDW: 13.3 % (ref 11.5–14.5)
WBC: 8.9 10*3/uL (ref 3.6–11.0)

## 2017-01-28 LAB — POCT PREGNANCY, URINE: PREG TEST UR: NEGATIVE

## 2017-01-28 NOTE — ED Provider Notes (Signed)
[facility] Emergency Department Provider Note    First MD Initiated Contact with Patient 01/28/17 2110     (approximate)  I have reviewed the triage vital signs and the nursing notes.   HISTORY  Chief Complaint Vaginal Bleeding    HPI Mary Campos is a 31 y.o. female who is 7 months postpartum presents with several weeks of right-sided pelvic pain and several days of vaginal spotting that were the patient tonight because she had several large blood clots pass. No chest pain or shortness of breath. No lightheadedness. Patient was seen for the same symptoms at her OB/GYN clinic this past week and had an ultrasound performed. The ultrasonographer told her that she had evidence of endometriosis but this has not been formally read. She called her OB/GYN to arrange close follow-up but they could not see her until Friday. With her passing blood clots today and concern for pregnancy she came to the ER.   Past Medical History:  Diagnosis Date  . Asthma    albuteral inhaler PRN   No family history on file. Past Surgical History:  Procedure Laterality Date  . CESAREAN SECTION     X 2  . CESAREAN SECTION WITH BILATERAL TUBAL LIGATION Bilateral 07/03/2016   Procedure: CESAREAN SECTION/ Female 8lbs. 3oz;  Surgeon: Nadara Mustard, MD;  Location: [facility] ORS;  Service: Obstetrics;  Laterality: Bilateral;  . TONSILLECTOMY     Patient Active Problem List   Diagnosis Date Noted  . Cesarean delivery delivered 07/04/2016  . History of 2 cesarean sections 07/03/2016  . Gestational hypertension 06/04/2016  . ANXIETY 08/11/2010  . ASTHMA, MILD, INTERMITTENT 08/11/2010      Prior to Admission medications   Medication Sig Start Date End Date Taking? Authorizing Provider  albuterol (ACCUNEB) 0.63 MG/3ML nebulizer solution Take 1 ampule by nebulization every 6 (six) hours as needed for wheezing.    Historical Provider, MD    Allergies Azithromycin; Eletriptan  hydrobromide; Pseudoephedrine; and Tape    Social History Social History  Substance Use Topics  . Smoking status: Never Smoker  . Smokeless tobacco: Never Used  . Alcohol use No    Review of Systems Patient denies headaches, rhinorrhea, blurry vision, numbness, shortness of breath, chest pain, edema, cough, abdominal pain, nausea, vomiting, diarrhea, dysuria, fevers, rashes or hallucinations unless otherwise stated above in HPI. ____________________________________________   PHYSICAL EXAM:  VITAL SIGNS: Vitals:   01/28/17 2042  BP: 111/68  Pulse: 64  Resp: 18  Temp: 97.9 F (36.6 C)    Constitutional: Alert and oriented. Well appearing and in no acute distress. Eyes: Conjunctivae are normal. PERRL. EOMI. Head: Atraumatic. Nose: No congestion/rhinnorhea. Mouth/Throat: Mucous membranes are moist.  Oropharynx non-erythematous. Neck: No stridor. Painless ROM. No cervical spine tenderness to palpation Hematological/Lymphatic/Immunilogical: No cervical lymphadenopathy. Cardiovascular: Normal rate, regular rhythm. Grossly normal heart sounds.  Good peripheral circulation. Respiratory: Normal respiratory effort.  No retractions. Lungs CTAB. Gastrointestinal: Soft and nontender. No distention. No abdominal bruits. No CVA tenderness. Genitourinary: defferred Musculoskeletal: No lower extremity tenderness nor edema.  No joint effusions. Neurologic:  Normal speech and language. No gross focal neurologic deficits are appreciated. No gait instability. Skin:  Skin is warm, dry and intact. No rash noted. Psychiatric: Mood and affect are normal. Speech and behavior are normal.  ____________________________________________   LABS (all labs ordered are listed, but only abnormal results are displayed)  Results for orders placed or performed during the hospital encounter of 01/28/17 (from the past 24 hour(s))  CBC     Status: None   Collection Time: 01/28/17  8:43 PM  Result Value Ref  Range   WBC 8.9 3.6 - 11.0 K/uL   RBC 4.87 3.80 - 5.20 MIL/uL   Hemoglobin 14.0 12.0 - 16.0 g/dL   HCT 16.1 09.6 - 04.5 %   MCV 83.7 80.0 - 100.0 fL   MCH 28.7 26.0 - 34.0 pg   MCHC 34.3 32.0 - 36.0 g/dL   RDW 40.9 81.1 - 91.4 %   Platelets 341 150 - 440 K/uL  Basic metabolic panel     Status: Abnormal   Collection Time: 01/28/17  8:43 PM  Result Value Ref Range   Sodium 140 135 - 145 mmol/L   Potassium 3.4 (L) 3.5 - 5.1 mmol/L   Chloride 107 101 - 111 mmol/L   CO2 26 22 - 32 mmol/L   Glucose, Bld 90 65 - 99 mg/dL   BUN 12 6 - 20 mg/dL   Creatinine, Ser 7.82 0.44 - 1.00 mg/dL   Calcium 9.3 8.9 - 95.6 mg/dL   GFR calc non Af Amer >60 >60 mL/min   GFR calc Af Amer >60 >60 mL/min   Anion gap 7 5 - 15  Urinalysis, Complete w Microscopic     Status: Abnormal   Collection Time: 01/28/17  8:43 PM  Result Value Ref Range   Color, Urine YELLOW (A) YELLOW   APPearance HAZY (A) CLEAR   Specific Gravity, Urine 1.023 1.005 - 1.030   pH 6.0 5.0 - 8.0   Glucose, UA NEGATIVE NEGATIVE mg/dL   Hgb urine dipstick LARGE (A) NEGATIVE   Bilirubin Urine NEGATIVE NEGATIVE   Ketones, ur NEGATIVE NEGATIVE mg/dL   Protein, ur NEGATIVE NEGATIVE mg/dL   Nitrite NEGATIVE NEGATIVE   Leukocytes, UA NEGATIVE NEGATIVE   RBC / HPF TOO NUMEROUS TO COUNT 0 - 5 RBC/hpf   WBC, UA 0-5 0 - 5 WBC/hpf   Bacteria, UA NONE SEEN NONE SEEN   Squamous Epithelial / LPF 0-5 (A) NONE SEEN   Mucous PRESENT   Pregnancy, urine POC     Status: None   Collection Time: 01/28/17  8:54 PM  Result Value Ref Range   Preg Test, Ur NEGATIVE NEGATIVE   ____________________________________________  ____________________________________________  RADIOLOGY   ____________________________________________   PROCEDURES  Procedure(s) performed:  Procedures    Critical Care performed: no ____________________________________________   INITIAL IMPRESSION / ASSESSMENT AND PLAN / ED COURSE  Pertinent labs & imaging results  that were available during my care of the patient were reviewed by me and considered in my medical decision making (see chart for details).  DDX: dub, endometriosis, ectopic  Mary Campos is a 31 y.o. who presents to the ED with abnormal uterine bleeding. Patient is afebrile and hemodynamically stable. She's not on any blood thinners. She is not pregnant therefore is not ectopic or miscarriage. Ultrasound performed in clinic does show what may be endometriosis. Unable to get formal read at this time. I did discuss with the patient recommendations for pelvic exam as well as ultrasound to be repeated here in the ER. The patient has declined this. She states that she feels reassured that is not a miscarriage and that her symptoms were consistent with a heavy menstrual cycle. She has no signs of acute blood loss anemia. Do feel patient is stable for discharge with close follow-up.  Patient was able to tolerate PO and was able to ambulate with a steady gait.  Have discussed with  the patient and available family all diagnostics and treatments performed thus far and all questions were answered to the best of my ability. The patient demonstrates understanding and agreement with plan.       ____________________________________________   FINAL CLINICAL IMPRESSION(S) / ED DIAGNOSES  Final diagnoses:  Vaginal bleeding      NEW MEDICATIONS STARTED DURING THIS VISIT:  New Prescriptions   No medications on file     Note:  This document was prepared using Dragon voice recognition software and may include unintentional dictation errors.    Willy Eddy, MD 01/28/17 2212

## 2017-01-28 NOTE — ED Triage Notes (Addendum)
Patient ambulatory to triage with steady gait, without difficulty or distress noted; pt reports abnormal vag bleeding, currently breastfeeding; had u/s at [facility] last wk, dx with poss endometriosis and has f/u Friday

## 2017-02-01 ENCOUNTER — Ambulatory Visit (INDEPENDENT_AMBULATORY_CARE_PROVIDER_SITE_OTHER): Payer: 59 | Admitting: Obstetrics & Gynecology

## 2017-02-01 VITALS — BP 120/80 | HR 79 | Ht 62.0 in | Wt 252.0 lb

## 2017-02-01 DIAGNOSIS — N800 Endometriosis of uterus: Secondary | ICD-10-CM

## 2017-02-01 DIAGNOSIS — N809 Endometriosis, unspecified: Secondary | ICD-10-CM

## 2017-02-01 DIAGNOSIS — N921 Excessive and frequent menstruation with irregular cycle: Secondary | ICD-10-CM | POA: Diagnosis not present

## 2017-02-01 DIAGNOSIS — F4321 Adjustment disorder with depressed mood: Secondary | ICD-10-CM

## 2017-02-01 DIAGNOSIS — N8003 Adenomyosis of the uterus: Secondary | ICD-10-CM

## 2017-02-01 MED ORDER — SERTRALINE HCL 50 MG PO TABS: 50.0000 mg | ORAL_TABLET | Freq: Every day | ORAL | 5 refills | Status: DC

## 2017-02-01 NOTE — Progress Notes (Signed)
  HPI: Dysfunctional Uterine Bleeding Patient complains of irregular menses. She had been bleeding irregularly. She is now bleeding frequently and unpredictably and menses are lasting several days. She changes her pad or tampon every 3 hours. Clots are 10 cm in size. Dysmenorrhea:severe, occurring throughout menses. Cyclic symptoms include: none. Current contraception: none. History of infertility: no. History of abnormal Pap smear: no.  Ultrasound demonstrates no masses seen, and findings c/w adenomyosis These findings include no cysts, fibroids, FF.  PMHx: She  has a past medical history of Asthma. Also,  has a past surgical history that includes Tonsillectomy; Cesarean section; and Cesarean section with bilateral tubal ligation (Bilateral, 07/03/2016)., family history is not on file.,  reports that she has never smoked. She has never used smokeless tobacco. She reports that she does not drink alcohol or use drugs.  She has a current medication list which includes the following prescription(s): albuterol and sertraline. Also, is allergic to azithromycin; eletriptan hydrobromide; pseudoephedrine; and tape.  Review of Systems  Constitutional: Negative for chills, fever and malaise/fatigue.  HENT: Negative for congestion, sinus pain and sore throat.   Eyes: Negative for blurred vision and pain.  Respiratory: Negative for cough and wheezing.   Cardiovascular: Negative for chest pain and leg swelling.  Gastrointestinal: Negative for abdominal pain, constipation, diarrhea, heartburn, nausea and vomiting.  Genitourinary: Negative for dysuria, frequency, hematuria and urgency.  Musculoskeletal: Negative for back pain, joint pain, myalgias and neck pain.  Skin: Negative for itching and rash.  Neurological: Negative for dizziness, tremors and weakness.  Endo/Heme/Allergies: Does not bruise/bleed easily.  Psychiatric/Behavioral: Negative for depression. The patient is not nervous/anxious and does not  have insomnia.     Objective: BP 120/80   Pulse 79   Ht  (1.575 m)   Wt 252 lb (114.3 kg)   BMI 46.09 kg/m   Physical examination Constitutional NAD, Conversant  Skin No rashes, lesions or ulceration.   Extremities: Moves all appropriately.  Normal ROM for age. No lymphadenopathy.  Neuro: Grossly intact  Psych: Oriented to PPT.  Normal mood. Normal affect.   Assessment:  Menometrorrhagia  Acute adjustment disorder with depressed mood - Plan: sertraline (ZOLOFT) 50 MG tablet  Adenomyosis  OCP x2 mos and see how affects bleeding.  Hysterectomy as other option.  Must be done w fertility.  Zoloft for mood stabilization.  SE discussed.  Annamarie Major, MD, [name], [facility] 02/01/2017  3:42 PM

## 2017-02-01 NOTE — Patient Instructions (Signed)
Adenomyosis, enlarged uterus

## 2017-02-06 ENCOUNTER — Encounter: Payer: Self-pay | Admitting: Obstetrics & Gynecology

## 2017-02-11 ENCOUNTER — Other Ambulatory Visit: Payer: Self-pay | Admitting: Obstetrics & Gynecology

## 2017-02-11 MED ORDER — MEDROXYPROGESTERONE ACETATE 10 MG PO TABS: 10.0000 mg | ORAL_TABLET | Freq: Every day | ORAL | 0 refills | Status: DC

## 2017-04-05 ENCOUNTER — Ambulatory Visit (INDEPENDENT_AMBULATORY_CARE_PROVIDER_SITE_OTHER): Payer: 59 | Admitting: Obstetrics & Gynecology

## 2017-04-05 ENCOUNTER — Encounter: Payer: Self-pay | Admitting: Obstetrics & Gynecology

## 2017-04-05 VITALS — BP 130/90 | HR 63 | Ht 62.0 in | Wt 260.0 lb

## 2017-04-05 DIAGNOSIS — N920 Excessive and frequent menstruation with regular cycle: Secondary | ICD-10-CM

## 2017-04-05 NOTE — Progress Notes (Signed)
  History of Present Illness:  Mary Campos is a 31 y.o. who was started on Zoloft  approximately 8 weeks ago. Since that time, she states that her symptoms are improving. Also started on OCPs but had acne SE so stopped.  Had one bad period which Provera did not help.  Then had another period in May as well.  No period in June yet.  PMHx: She  has a past medical history of Asthma. Also,  has a past surgical history that includes Tonsillectomy; Cesarean section; and Cesarean section with bilateral tubal ligation (Bilateral, 07/03/2016)., family history includes Hypertension in her mother.,  reports that she has never smoked. She has never used smokeless tobacco. She reports that she does not drink alcohol or use drugs. No outpatient prescriptions have been marked as taking for the 04/05/17 encounter (Office Visit) with Mary Campos.  . Also, is allergic to azithromycin; eletriptan hydrobromide; pseudoephedrine; and tape..  Review of Systems  All other systems reviewed and are negative.  Physical Exam:  BP 130/90   Pulse 63   Ht 5\' 2"  (1.575 m)   Wt 260 lb (117.9 kg)   BMI 47.55 kg/m  Body mass index is 47.55 kg/m. Constitutional: Well nourished, well developed female in no acute distress.  Abdomen: diffusely non tender to palpation, non distended, and no masses, hernias Neuro: Grossly intact Psych:  Normal mood and affect.    Assessment:  Problem List Items Addressed This Visit    Anxiety    Visit Diagnoses    Menorrhagia with regular cycle    -  Primary     Medication treatment is going very well for her anxiety.  Plan: She will undergo no change in her medical therapy. She will monitor cycles and knows hysterectomy is next surgical option but must be sure no more pregnancies desired.  Prior US showing adenomyosis.  OCP and Provera no help.  Does not want Depo.  She was amenable to this plan and we will see her back for annual/PRN.  Mary Campos, Mary Campos  Mary Campos, [facility] 04/05/2017  3:06 PM

## 2017-08-12 ENCOUNTER — Encounter: Payer: Self-pay | Admitting: Obstetrics & Gynecology

## 2017-08-26 ENCOUNTER — Ambulatory Visit (INDEPENDENT_AMBULATORY_CARE_PROVIDER_SITE_OTHER): Payer: 59 | Admitting: Obstetrics and Gynecology

## 2017-08-26 ENCOUNTER — Encounter: Payer: Self-pay | Admitting: Obstetrics and Gynecology

## 2017-08-26 VITALS — BP 120/78 | HR 70 | Wt 269.0 lb

## 2017-08-26 DIAGNOSIS — Z3A01 Less than 8 weeks gestation of pregnancy: Secondary | ICD-10-CM

## 2017-08-26 DIAGNOSIS — Z98891 History of uterine scar from previous surgery: Secondary | ICD-10-CM

## 2017-08-26 DIAGNOSIS — Z8759 Personal history of other complications of pregnancy, childbirth and the puerperium: Secondary | ICD-10-CM | POA: Insufficient documentation

## 2017-08-26 DIAGNOSIS — O99210 Obesity complicating pregnancy, unspecified trimester: Secondary | ICD-10-CM

## 2017-08-26 NOTE — Progress Notes (Signed)
New Obstetric Patient H&P    Chief Complaint: "Desires prenatal care"   History of Present Illness: Patient is a 31 y.o. [phone] Not Hispanic or Latino female,  Patient's last menstrual period was 07/12/2017. presents with amenorrhea and positive home pregnancy test. Based on her  LMP, her Estimated Date of Delivery: 04/18/18 at 6431w3d. Cycles are regular, and occur approximately every : 28 days. Her last pap smear was 1 years ago and was no abnormalities.    She had a urine pregnancy test which was positive 1 week(s)  ago. Her last menstrual period was normal and lasted for  5 day(s). Since her LMP she claims she has experienced fatigue, breast tenderness. She denies vaginal bleeding. Her past medical history is contibutory for obesity.. Her prior pregnancies are notable for gestational HTN, cesarean section x 3  Since her LMP, she admits to the use of tobacco products  no She claims she has gained   no pounds since the start of her pregnancy.  There are cats in the home in the home  no If yes N/A She admits close contact with children on a regular basis  yes  She has had chicken pox in the past yes She has had Tuberculosis exposures, symptoms, or previously tested positive for TB   no Current or past history of domestic violence. no  Genetic Screening/Teratology Counseling: (Includes patient, baby's father, or anyone in either family with:)   1. [facility]'s age [facility]/= 2835 [facility]  no 2. Thalassemia ([place], [place], Mediterranean, or Asian background): MCV<80  no 3. Neural tube defect (meningomyelocele, spina bifida, anencephaly)  no 4. Congenital heart defect  no  5. Down syndrome  no 6. Tay-Sachs (Jewish, [place])  no 7. Canavan's Disease  no 8. Sickle cell disease or trait (African)  no  9. Hemophilia or other blood disorders  no  10. Muscular dystrophy  no  11. Cystic fibrosis  no  12. Huntington's Chorea  no  13. Mental retardation/autism  no 14. Other inherited genetic or  chromosomal disorder  no 15. Maternal metabolic disorder (DM, PKU, etc)  no 16. Patient or FOB with a child with a birth defect not listed above no  16a. Patient or FOB with a birth defect themselves no 17. Recurrent pregnancy loss, or stillbirth  no  18. Any medications since LMP other than prenatal vitamins (include vitamins, supplements, OTC meds, drugs, alcohol)  no 19. Any other genetic/environmental exposure to discuss  no  Infection History:   1. Lives with someone with TB or TB exposed  no  2. Patient or partner has history of genital herpes  no 3. Rash or viral illness since LMP  no 4. History of STI (GC, CT, HPV, syphilis, HIV)  no 5. History of recent travel :  no  Other pertinent information:  no     Review of Systems:10 point review of systems negative unless otherwise noted in HPI  Past Medical History:  Past Medical History:  Diagnosis Date  . Asthma    albuteral inhaler PRN    Past Surgical History:  Past Surgical History:  Procedure Laterality Date  . CESAREAN SECTION     X 2  . TONSILLECTOMY      Gynecologic History: Patient's last menstrual period was 07/12/2017.  Obstetric History: [id number]  Family History:  Family History  Problem Relation Age of Onset  . Hypertension Mother     Social History:  Social History   Socioeconomic History  . Marital  status: Married    Spouse name: Not on file  . Number of children: Not on file  . Years of education: Not on file  . Highest education level: Not on file  Social Needs  . Financial resource strain: Not on file  . Food insecurity - worry: Not on file  . Food insecurity - inability: Not on file  . Transportation needs - medical: Not on file  . Transportation needs - non-medical: Not on file  Occupational History  . Not on file  Tobacco Use  . Smoking status: Never Smoker  . Smokeless tobacco: Never Used  Substance and Sexual Activity  . Alcohol use: No  . Drug use: No  . Sexual activity:  Yes    Birth control/protection: None  Other Topics Concern  . Not on file  Social History Narrative  . Not on file    Allergies:  Allergies  Allergen Reactions  . Azithromycin     REACTION: Gastric, S.O.B  . Eletriptan Hydrobromide     REACTION: S.O.B  . Pseudoephedrine     REACTION: Unable to focus  . Tape Rash    Surgical tape    Medications: Prior to Admission medications   Medication Sig Start Date End Date Taking? Authorizing Provider  albuterol (ACCUNEB) 0.63 MG/3ML nebulizer solution Take 1 ampule by nebulization every 6 (six) hours as needed for wheezing.   Yes [provider]  IRON PO Take by mouth.   Yes [provider]  loratadine (CLARITIN) 10 MG tablet Take by mouth.   Yes [provider]  sertraline (ZOLOFT) 50 MG tablet Take 1 tablet (50 mg total) by mouth daily. 02/01/17  Yes Nadara Mustard, MD    Physical Exam Vitals: Blood pressure 120/78, pulse 70, weight 269 lb (122 kg), last menstrual period 07/12/2017, not currently breastfeeding. Body mass index is 49.2 kg/m.  General: NAD HEENT: normocephalic, anicteric Thyroid: no enlargement, no palpable nodules Pulmonary: No increased work of breathing, CTAB Cardiovascular: RRR, distal pulses 2+ Abdomen: NABS, soft, non-tender, non-distended.  Umbilicus without lesions.  No hepatomegaly, splenomegaly or masses palpable. No evidence of hernia  Genitourinary:  External: Normal external female genitalia.  Normal urethral meatus, normal  Bartholin's and Skene's glands.    Vagina: Normal vaginal mucosa, no evidence of prolapse.    Cervix: Grossly normal in appearance, no bleeding  Uterus:  Non-enlarged, mobile, normal contour.  No CMT  Adnexa: ovaries non-enlarged, no adnexal masses  Rectal: deferred Extremities: no edema, erythema, or tenderness Neurologic: Grossly intact Psychiatric: mood appropriate, affect full   Assessment: 31 y.o. [id number] at Unknown presenting to initiate  prenatal care  Plan: 1) Avoid alcoholic beverages. 2) Patient encouraged not to smoke.  3) Discontinue the use of all non-medicinal drugs and chemicals.  4) Take prenatal vitamins daily.  5) Nutrition, food safety (fish, cheese advisories, and high nitrite foods) and exercise discussed. 6) Hospital and practice style discussed with cross coverage system.  7) Genetic Screening, such as with 1st Trimester Screening, cell free fetal DNA, AFP testing, and Ultrasound, as well as with amniocentesis and CVS as appropriate, is discussed with patient. At the conclusion of today's visit patient declined genetic testing 8) Patient is asked about travel to areas at risk for the [place] virus, and counseled to avoid travel and exposure to mosquitoes or sexual partners who may have themselves been exposed to the virus. Testing is discussed, and will be ordered as appropriate.

## 2017-08-26 NOTE — Progress Notes (Signed)
NOB N&V 

## 2017-08-27 ENCOUNTER — Other Ambulatory Visit: Payer: Self-pay | Admitting: Obstetrics and Gynecology

## 2017-08-27 ENCOUNTER — Encounter: Payer: Self-pay | Admitting: Obstetrics and Gynecology

## 2017-08-27 DIAGNOSIS — F4321 Adjustment disorder with depressed mood: Secondary | ICD-10-CM

## 2017-08-27 LAB — PROTEIN / CREATININE RATIO, URINE
CREATININE, UR: 66.6 mg/dL
PROTEIN UR: 4.4 mg/dL
Protein/Creat Ratio: 66 mg/g creat (ref 0–200)

## 2017-08-27 MED ORDER — FOLIC ACID 1 MG PO TABS: 1.0000 mg | ORAL_TABLET | Freq: Every day | ORAL | 10 refills | Status: DC

## 2017-08-27 MED ORDER — SERTRALINE HCL 50 MG PO TABS: 50.0000 mg | ORAL_TABLET | Freq: Every day | ORAL | 5 refills | Status: DC

## 2017-08-27 MED ORDER — DOXYLAMINE-PYRIDOXINE ER 20-20 MG PO TBCR: 1.0000 | EXTENDED_RELEASE_TABLET | ORAL | 2 refills | Status: DC

## 2017-08-28 LAB — URINE CULTURE

## 2017-08-28 LAB — GC/CHLAMYDIA PROBE AMP
CHLAMYDIA, DNA PROBE: NEGATIVE
NEISSERIA GONORRHOEAE BY PCR: NEGATIVE

## 2017-08-29 ENCOUNTER — Encounter: Payer: Self-pay | Admitting: Obstetrics and Gynecology

## 2017-08-29 DIAGNOSIS — O99210 Obesity complicating pregnancy, unspecified trimester: Secondary | ICD-10-CM | POA: Insufficient documentation

## 2017-08-29 DIAGNOSIS — O0990 Supervision of high risk pregnancy, unspecified, unspecified trimester: Secondary | ICD-10-CM | POA: Insufficient documentation

## 2017-09-05 ENCOUNTER — Ambulatory Visit (INDEPENDENT_AMBULATORY_CARE_PROVIDER_SITE_OTHER): Payer: 59 | Admitting: Obstetrics & Gynecology

## 2017-09-05 ENCOUNTER — Other Ambulatory Visit: Payer: 59

## 2017-09-05 ENCOUNTER — Ambulatory Visit (INDEPENDENT_AMBULATORY_CARE_PROVIDER_SITE_OTHER): Payer: 59

## 2017-09-05 VITALS — BP 120/70 | Wt 274.0 lb

## 2017-09-05 DIAGNOSIS — O0990 Supervision of high risk pregnancy, unspecified, unspecified trimester: Secondary | ICD-10-CM

## 2017-09-05 DIAGNOSIS — O99210 Obesity complicating pregnancy, unspecified trimester: Secondary | ICD-10-CM

## 2017-09-05 DIAGNOSIS — Z362 Encounter for other antenatal screening follow-up: Secondary | ICD-10-CM | POA: Diagnosis not present

## 2017-09-05 DIAGNOSIS — Z3A01 Less than 8 weeks gestation of pregnancy: Secondary | ICD-10-CM | POA: Diagnosis not present

## 2017-09-05 DIAGNOSIS — Z8759 Personal history of other complications of pregnancy, childbirth and the puerperium: Secondary | ICD-10-CM

## 2017-09-05 DIAGNOSIS — Z98891 History of uterine scar from previous surgery: Secondary | ICD-10-CM

## 2017-09-05 MED ORDER — PROMETHAZINE HCL 25 MG PO TABS: ORAL_TABLET | ORAL | 2 refills | Status: DC

## 2017-09-05 NOTE — Progress Notes (Signed)
  Subjective  Fetal Movement? no Contractions? no Leaking Fluid? no Vaginal Bleeding? no  Objective  BP 120/70   Wt 274 lb (124.3 kg)   LMP 07/12/2017   BMI 50.12 kg/m  General: NAD Pumonary: no increased work of breathing Abdomen: gravid, non-tender Extremities: no edema Psychiatric: mood appropriate, affect full  Clinic WS Prenatal Labs  Dating US Blood type:     Genetic Screen Declines Antibody:   Anatomic US  Rubella:    GTT Early:   x      Third trimester:  RPR:     Flu vaccine  HBsAg:     TDaP vaccine                          Rhogam: HIV:     Baby Food                                               GBS: (For PCN allergy, check sensitivities)  Contraception  Pap:  CS/VBAC CS 39 weeks PH   Cord Blood    Support Person     Assessment   31 y.o. [id number] at 4431w6d by  04/18/2018, by Last Menstrual Period presenting for routine prenatal visit  Plan   Problem List Items Addressed This Visit      Other   History of 3 cesarean sections   Relevant Orders   Ambulatory referral to Anesthesiology   Supervision of high risk pregnancy, antepartum   Maternal obesity, antepartum   Relevant Orders   Ambulatory referral to Anesthesiology    Other Visit Diagnoses    [redacted] weeks gestation of pregnancy    -  Primary    Labs today, incl baseline glucola and CMP US discussed  - - - Review of ULTRASOUND.    I have personally reviewed images and report of [facility].    Plan of management to be discussed with patient. CS discussed and needed at 39 weeks.    BMI (49) risks and surgery discussed.  She has been this weight/BMI throughout past pregnancies as well with min weight changes and has done well w prior CS.  Will obtain anesthesia consult.  [facility] unless deemed ill advised.  Mary MajorPaul Ruel Dimmick, MD, Merlinda FrederickFACOG [facility], [facility] 09/05/2017  10:57 AM

## 2017-09-05 NOTE — Patient Instructions (Signed)

## 2017-09-06 ENCOUNTER — Encounter: Payer: Self-pay | Admitting: Obstetrics and Gynecology

## 2017-09-06 ENCOUNTER — Telehealth: Payer: Self-pay

## 2017-09-06 ENCOUNTER — Encounter: Payer: Self-pay | Admitting: Obstetrics & Gynecology

## 2017-09-06 LAB — RPR+RH+ABO+RUB AB+AB SCR+CB...
Antibody Screen: NEGATIVE
HEMOGLOBIN: 13 g/dL (ref 11.1–15.9)
HEP B S AG: NEGATIVE
HIV Screen 4th Generation wRfx: NONREACTIVE
Hematocrit: 37.7 % (ref 34.0–46.6)
MCH: 29.1 pg (ref 26.6–33.0)
MCHC: 34.5 g/dL (ref 31.5–35.7)
MCV: 84 fL (ref 79–97)
PLATELETS: 298 10*3/uL (ref 150–379)
RBC: 4.47 x10E6/uL (ref 3.77–5.28)
RDW: 14 % (ref 12.3–15.4)
RPR Ser Ql: NONREACTIVE
Rh Factor: POSITIVE
Rubella Antibodies, IGG: 2.18 index (ref >0.99)
VARICELLA: 2361 {index} (ref >165)
WBC: 6.9 10*3/uL (ref 3.4–10.8)

## 2017-09-06 LAB — COMPREHENSIVE METABOLIC PANEL
A/G RATIO: 1.5 (ref 1.2–2.2)
ALK PHOS: 118 IU/L — AB (ref 39–117)
ALT: 13 IU/L (ref 0–32)
AST: 11 IU/L (ref 0–40)
Albumin: 3.8 g/dL (ref 3.5–5.5)
BILIRUBIN TOTAL: 0.5 mg/dL (ref 0.0–1.2)
BUN/Creatinine Ratio: 12 (ref 9–23)
BUN: 10 mg/dL (ref 6–20)
CHLORIDE: 102 mmol/L (ref 96–106)
CO2: 24 mmol/L (ref 20–29)
Calcium: 9.2 mg/dL (ref 8.7–10.2)
Creatinine, Ser: 0.81 mg/dL (ref 0.57–1.00)
GFR calc non Af Amer: 98 mL/min/{1.73_m2} (ref >59)
GFR, EST AFRICAN AMERICAN: 114 mL/min/{1.73_m2} (ref >59)
GLUCOSE: 76 mg/dL (ref 65–99)
Globulin, Total: 2.6 g/dL (ref 1.5–4.5)
POTASSIUM: 4.5 mmol/L (ref 3.5–5.2)
Sodium: 138 mmol/L (ref 134–144)
Total Protein: 6.4 g/dL (ref 6.0–8.5)

## 2017-09-06 LAB — GLUCOSE TOLERANCE, 1 HOUR: Glucose, 1Hr PP: 74 mg/dL (ref 65–199)

## 2017-09-06 LAB — TSH: TSH: 0.703 u[IU]/mL (ref 0.450–4.500)

## 2017-09-11 ENCOUNTER — Inpatient Hospital Stay: Admission: RE | Admit: 2017-09-11 | Payer: 59 | Source: Ambulatory Visit

## 2017-09-19 ENCOUNTER — Encounter
Admission: RE | Admit: 2017-09-19 | Discharge: 2017-09-19 | Disposition: A | Discharge status: Home / Self Care | Payer: 59 | Source: Ambulatory Visit | Attending: Anesthesiology | Admitting: Anesthesiology

## 2017-09-26 ENCOUNTER — Encounter
Admission: RE | Admit: 2017-09-26 | Discharge: 2017-09-26 | Disposition: A | Discharge status: Home / Self Care | Payer: 59 | Source: Ambulatory Visit | Attending: Obstetrics & Gynecology | Admitting: Obstetrics & Gynecology

## 2017-10-02 ENCOUNTER — Ambulatory Visit (INDEPENDENT_AMBULATORY_CARE_PROVIDER_SITE_OTHER): Payer: 59 | Admitting: Obstetrics and Gynecology

## 2017-10-02 VITALS — BP 110/62 | Wt 279.0 lb

## 2017-10-02 DIAGNOSIS — Z3A11 11 weeks gestation of pregnancy: Secondary | ICD-10-CM

## 2017-10-02 DIAGNOSIS — Z98891 History of uterine scar from previous surgery: Secondary | ICD-10-CM

## 2017-10-02 DIAGNOSIS — Z8759 Personal history of other complications of pregnancy, childbirth and the puerperium: Secondary | ICD-10-CM

## 2017-10-02 DIAGNOSIS — O99210 Obesity complicating pregnancy, unspecified trimester: Secondary | ICD-10-CM

## 2017-10-02 DIAGNOSIS — O0990 Supervision of high risk pregnancy, unspecified, unspecified trimester: Secondary | ICD-10-CM

## 2017-10-02 MED ORDER — PROCHLORPERAZINE MALEATE 10 MG PO TABS: 10.0000 mg | ORAL_TABLET | Freq: Four times a day (QID) | ORAL | 3 refills | Status: DC | PRN

## 2017-10-02 NOTE — Progress Notes (Signed)
ROB Headache past few days

## 2017-10-02 NOTE — Progress Notes (Signed)
Positive FHT on bedside US Trial compazine for headaches, discussed magnesium supplementation

## 2017-10-30 ENCOUNTER — Ambulatory Visit (INDEPENDENT_AMBULATORY_CARE_PROVIDER_SITE_OTHER): Payer: 59 | Admitting: Obstetrics & Gynecology

## 2017-10-30 VITALS — BP 130/68 | Wt 277.0 lb

## 2017-10-30 DIAGNOSIS — Z98891 History of uterine scar from previous surgery: Secondary | ICD-10-CM

## 2017-10-30 DIAGNOSIS — O0990 Supervision of high risk pregnancy, unspecified, unspecified trimester: Secondary | ICD-10-CM

## 2017-10-30 DIAGNOSIS — Z3A15 15 weeks gestation of pregnancy: Secondary | ICD-10-CM

## 2017-10-30 MED ORDER — BUTALBITAL-APAP-CAFFEINE 50-325-40 MG PO CAPS: 1.0000 | ORAL_CAPSULE | Freq: Four times a day (QID) | ORAL | 1 refills | Status: DC | PRN

## 2017-10-30 NOTE — Patient Instructions (Signed)

## 2017-10-30 NOTE — Addendum Note (Signed)
Addended by: Nadara MustardHARRIS, Chereese Cilento P on: 10/30/2017 11:59 AM   Modules accepted: Orders

## 2017-10-30 NOTE — Progress Notes (Signed)
C/O bad headaches & pain in pubic area when walking since last visit. Compazine and Magnesium, min help w headaches H/o CS x3  Normal exam/ FHTs Plan Fioricet for headaches Counseled as to CS adhesion pain and preg pain related to her pelvic intermittant pains Anesthesia consult gave her the go ahead [facility] unless significant weight gain this preg occurs US nv  Mary MajorPaul [name], MD, Merlinda FrederickFACOG [facility], [facility] 10/30/2017  11:41 AM

## 2017-11-27 ENCOUNTER — Encounter: Payer: 59 | Admitting: Obstetrics & Gynecology

## 2017-11-27 ENCOUNTER — Ambulatory Visit (INDEPENDENT_AMBULATORY_CARE_PROVIDER_SITE_OTHER): Payer: 59 | Admitting: Obstetrics and Gynecology

## 2017-11-27 ENCOUNTER — Ambulatory Visit (INDEPENDENT_AMBULATORY_CARE_PROVIDER_SITE_OTHER): Payer: 59

## 2017-11-27 ENCOUNTER — Other Ambulatory Visit: Payer: 59

## 2017-11-27 VITALS — BP 114/78 | Wt 284.0 lb

## 2017-11-27 DIAGNOSIS — Z3A19 19 weeks gestation of pregnancy: Secondary | ICD-10-CM

## 2017-11-27 DIAGNOSIS — Z98891 History of uterine scar from previous surgery: Secondary | ICD-10-CM

## 2017-11-27 DIAGNOSIS — IMO0002 Reserved for concepts with insufficient information to code with codable children: Secondary | ICD-10-CM

## 2017-11-27 DIAGNOSIS — O99210 Obesity complicating pregnancy, unspecified trimester: Secondary | ICD-10-CM

## 2017-11-27 DIAGNOSIS — O0990 Supervision of high risk pregnancy, unspecified, unspecified trimester: Secondary | ICD-10-CM

## 2017-11-27 DIAGNOSIS — Z0489 Encounter for examination and observation for other specified reasons: Secondary | ICD-10-CM

## 2017-11-27 DIAGNOSIS — Z8759 Personal history of other complications of pregnancy, childbirth and the puerperium: Secondary | ICD-10-CM

## 2017-11-27 NOTE — Progress Notes (Signed)
Dictation #1 [id number]  [id number]    Routine Prenatal Care Visit  Subjective  Mary Campos is a 32 y.o. G4P3003 at 7334w5d being seen today for ongoing prenatal care.  She is currently monitored for the following issues for this high-risk pregnancy and has ANXIETY; ASTHMA, MILD, INTERMITTENT; History of gestational hypertension; History of 3 cesarean sections; Supervision of high risk pregnancy, antepartum; and Maternal obesity, antepartum on their problem list.  ----------------------------------------------------------------------------------- Patient reports no complaints.   Contractions: Not present. Vag. Bleeding: None.  Movement: Absent. Denies leaking of fluid.  ----------------------------------------------------------------------------------- The following portions of the patient's history were reviewed and updated as appropriate: allergies, current medications, past family history, past medical history, past social history, past surgical history and problem list. Problem list updated.   Objective  Blood pressure 114/78, weight 284 lb (128.8 kg), last menstrual period 07/12/2017, not currently breastfeeding. Pregravid weight 269 lb (122 kg) Total Weight Gain 15 lb (6.804 kg) Urinalysis: Urine Protein: Negative Urine Glucose: Negative  Fetal Status: Fetal Heart Rate (bpm): 145   Movement: Absent     General:  Alert, oriented and cooperative. Patient is in no acute distress.  Skin: Skin is warm and dry. No rash noted.   Cardiovascular: Normal heart rate noted  Respiratory: Normal respiratory effort, no problems with respiration noted  Abdomen: Soft, gravid, appropriate for gestational age. Pain/Pressure: Absent     Pelvic:  Cervical exam deferred        Extremities: Normal range of motion.     ental Status: Normal mood and affect. Normal behavior. Normal judgment and thought content.     Assessment   32 y.o. [id number] at 3234w5d by  04/18/2018, by Last Menstrual Period  presenting for routine prenatal visit  Plan   Pregnancy #4 Problems (from 07/12/17 to present)    Problem Noted Resolved   Supervision of high risk pregnancy, antepartum 08/29/2017 by Vena AustriaStaebler, Lavaris Sexson, MD No   Overview Addendum 11/27/2017  2:14 PM by Vena AustriaStaebler, Radley Barto, MD    [facility]inic [facility] Prenatal Labs  Dating LMP = 7 week US Blood type: A positive  Genetic Screen Declines Antibody: negative  Anatomic US Female incomplete [ ]  F/U 24-weeks Rubella: Immune Varicella: Immune  GTT Early: 5674              Third trimester:  RPR: NR  Rhogam N/A HBsAg: negative  TDaP vaccine                  Flu Shot:08/27/17 HIV: negative  Baby Food                                GBS:   Contraception  Pap: 2017 NIL  CBB     CS/VBAC Repeat C-section   Support Person            Maternal obesity, antepartum 08/29/2017 by Vena AustriaStaebler, Blase Beckner, MD No   Overview Signed 08/29/2017 10:49 PM by Vena AustriaStaebler, Lucy Woolever, MD    BMI >=40 [X]  early 1h gtt -  [X]  u/s for dating [ ]   [ ]  nutritional goals [X]  folic acid 1mg  [ ]  bASA (>12 weeks) [ ]  consider nutrition consult [ ]  consider maternal EKG 1st trimester [ ]  Growth u/s 28 [ ] , 32 [ ] , 36 weeks [ ]  [ ]  NST/AFI weekly 36+ weeks (36[] , 37[] , 38[] , 39[] , 40[] ) [ ]  IOL by 41 weeks (scheduled, prn [] )      History of gestational hypertension  08/26/2017 by Vena Austria, MD No   Overview Signed 08/29/2017 10:50 PM by Vena Austria, MD    Baseline and surveillance labs (pulled in from [facility], refresh links as needed)  Lab Results  Component Value Date   PLT 341 01/28/2017   CREATININE 0.89 01/28/2017   AST 17 06/04/2016   ALT 16 06/04/2016   PROTCRRATIO 0.13 06/04/2016          History of 3 cesarean sections 08/26/2017 by Vena Austria, MD No       Preterm labor symptoms and general obstetric precautions including but not limited to vaginal bleeding, contractions, leaking of fluid and fetal movement were reviewed in detail with the patient. Please  refer to After Visit Summary for other counseling recommendations.  - incomplete anatomy scan follow up next visit  Return in about 4 weeks (around 12/25/2017) for ROB and follow up anatomy scan.

## 2017-11-27 NOTE — Progress Notes (Signed)
ROB Anatomy scan today/ IT'S a Girl

## 2017-12-13 ENCOUNTER — Telehealth: Payer: Self-pay

## 2017-12-13 ENCOUNTER — Other Ambulatory Visit: Payer: Self-pay | Admitting: Obstetrics and Gynecology

## 2017-12-13 ENCOUNTER — Encounter: Payer: Self-pay | Admitting: Obstetrics and Gynecology

## 2017-12-13 MED ORDER — OSELTAMIVIR PHOSPHATE 75 MG PO CAPS: 75.0000 mg | ORAL_CAPSULE | Freq: Every day | ORAL | 0 refills | Status: AC

## 2017-12-13 NOTE — Telephone Encounter (Signed)
Pt aware.

## 2017-12-13 NOTE — Telephone Encounter (Signed)
Please advise 

## 2017-12-13 NOTE — Telephone Encounter (Signed)
Rx has been called in  

## 2017-12-13 NOTE — Progress Notes (Signed)
Household contact flu exposure during pregnancy rx tamiflu called in

## 2017-12-13 NOTE — Telephone Encounter (Signed)
Pt states AMS told her if one of her kids tested + for flu, he may put her on Tamiflu for precaution. Her daughter ran a high fever & just was diagnosed w/flu. Cb#[phone]

## 2017-12-23 ENCOUNTER — Encounter: Payer: Self-pay | Admitting: Obstetrics and Gynecology

## 2017-12-23 ENCOUNTER — Encounter: Payer: Self-pay | Admitting: Emergency Medicine

## 2017-12-23 ENCOUNTER — Emergency Department
Admission: EM | Admit: 2017-12-23 | Discharge: 2017-12-23 | Disposition: A | Discharge status: Home / Self Care | Payer: 59 | Attending: Emergency Medicine | Admitting: Emergency Medicine

## 2017-12-23 ENCOUNTER — Other Ambulatory Visit: Payer: Self-pay

## 2017-12-23 DIAGNOSIS — J101 Influenza due to other identified influenza virus with other respiratory manifestations: Secondary | ICD-10-CM | POA: Insufficient documentation

## 2017-12-23 DIAGNOSIS — Z79899 Other long term (current) drug therapy: Secondary | ICD-10-CM | POA: Diagnosis not present

## 2017-12-23 DIAGNOSIS — Z3A24 24 weeks gestation of pregnancy: Secondary | ICD-10-CM | POA: Insufficient documentation

## 2017-12-23 DIAGNOSIS — J4520 Mild intermittent asthma, uncomplicated: Secondary | ICD-10-CM | POA: Diagnosis not present

## 2017-12-23 DIAGNOSIS — O98512 Other viral diseases complicating pregnancy, second trimester: Secondary | ICD-10-CM | POA: Insufficient documentation

## 2017-12-23 LAB — CBC WITH DIFFERENTIAL/PLATELET
BASOS ABS: 0 10*3/uL (ref 0–0.1)
Basophils Relative: 0 %
EOS PCT: 0 %
Eosinophils Absolute: 0 10*3/uL (ref 0–0.7)
HCT: 35 % (ref 35.0–47.0)
Hemoglobin: 12 g/dL (ref 12.0–16.0)
LYMPHS PCT: 3 %
Lymphs Abs: 0.2 10*3/uL — ABNORMAL LOW (ref 1.0–3.6)
MCH: 30.1 pg (ref 26.0–34.0)
MCHC: 34.3 g/dL (ref 32.0–36.0)
MCV: 87.6 fL (ref 80.0–100.0)
Monocytes Absolute: 0.4 10*3/uL (ref 0.2–0.9)
Monocytes Relative: 6 %
Neutro Abs: 6.2 10*3/uL (ref 1.4–6.5)
Neutrophils Relative %: 91 %
PLATELETS: 231 10*3/uL (ref 150–440)
RBC: 4 MIL/uL (ref 3.80–5.20)
RDW: 14.6 % — ABNORMAL HIGH (ref 11.5–14.5)
WBC: 6.9 10*3/uL (ref 3.6–11.0)

## 2017-12-23 LAB — BASIC METABOLIC PANEL
ANION GAP: 5 (ref 5–15)
BUN: 8 mg/dL (ref 6–20)
CO2: 22 mmol/L (ref 22–32)
Calcium: 8 mg/dL — ABNORMAL LOW (ref 8.9–10.3)
Chloride: 103 mmol/L (ref 101–111)
Creatinine, Ser: 0.58 mg/dL (ref 0.44–1.00)
GLUCOSE: 110 mg/dL — AB (ref 65–99)
POTASSIUM: 3.8 mmol/L (ref 3.5–5.1)
Sodium: 130 mmol/L — ABNORMAL LOW (ref 135–145)

## 2017-12-23 LAB — INFLUENZA PANEL BY PCR (TYPE A & B)
Influenza A By PCR: POSITIVE — AB
Influenza B By PCR: NEGATIVE

## 2017-12-23 MED ORDER — SODIUM CHLORIDE 0.9 % IV BOLUS (SEPSIS)
1000.0000 mL | Freq: Once | INTRAVENOUS | Status: AC
  Administered 2017-12-23: 1000 mL via INTRAVENOUS

## 2017-12-23 MED ORDER — ONDANSETRON HCL 4 MG/2ML IJ SOLN
4.0000 mg | Freq: Once | INTRAMUSCULAR | Status: AC
  Administered 2017-12-23: 4 mg via INTRAVENOUS
  Filled 2017-12-23: qty 2

## 2017-12-23 MED ORDER — PROMETHAZINE HCL 25 MG PO TABS: 25.0000 mg | ORAL_TABLET | Freq: Four times a day (QID) | ORAL | 0 refills | Status: DC | PRN

## 2017-12-23 NOTE — ED Triage Notes (Signed)
Pt to ED c/o URI, sore throat, productive cough, body aches for a couple days.  Daughter at home tested positive for flu last week.  Patient [redacted] weeks pregnant without complications.

## 2017-12-23 NOTE — ED Provider Notes (Signed)
[facility] Emergency Department Provider Note  ____________________________________________   None    (approximate)  I have reviewed the triage vital signs and the nursing notes.   HISTORY  Chief Complaint URI and Generalized Body Aches    HPI Mary Campos is a 32 y.o. female resents to the emergency department complaining of sore throat, cough and body aches for 2 days.  States her daughter tested positive for the flu last week.  Dr. Georgianne Fick called in a prescription for Tamiflu as a preventive for her.  It is made her so nauseated that she cannot take the medication.  She denies any actual vomiting or diarrhea.  However she is not able to drink many fluids.  She is [redacted] weeks pregnant.  She denies any abdominal pain, vaginal bleeding or cramping  Past Medical History:  Diagnosis Date  . Asthma    albuteral inhaler PRN    Patient Active Problem List   Diagnosis Date Noted  . Supervision of high risk pregnancy, antepartum 08/29/2017  . Maternal obesity, antepartum 08/29/2017  . History of gestational hypertension 08/26/2017  . History of 3 cesarean sections 08/26/2017  . ANXIETY 08/11/2010  . ASTHMA, MILD, INTERMITTENT 08/11/2010    Past Surgical History:  Procedure Laterality Date  . CESAREAN SECTION     X 2  . CESAREAN SECTION WITH BILATERAL TUBAL LIGATION Bilateral 07/03/2016   Procedure: CESAREAN SECTION/ Female 8lbs. 3oz;  Surgeon: Gae Dry, MD;  Location: [facility] ORS;  Service: Obstetrics;  Laterality: Bilateral;  . TONSILLECTOMY      Prior to Admission medications   Medication Sig Start Date End Date Taking? Authorizing Provider  albuterol (ACCUNEB) 0.63 MG/3ML nebulizer solution Take 1 ampule by nebulization every 6 (six) hours as needed for wheezing.    [provider]  Butalbital-APAP-Caffeine [phone] MG capsule Take 1-2 capsules by mouth every 6 (six) hours as needed for headache. 10/30/17   Gae Dry, MD  folic  acid (FOLVITE) 1 MG tablet Take 1 tablet (1 mg total) daily by mouth. 08/27/17   Malachy Mood, MD  loratadine (CLARITIN) 10 MG tablet Take by mouth.    [provider]  oseltamivir (TAMIFLU) 75 MG capsule Take 1 capsule (75 mg total) by mouth daily for 10 days. 12/13/17 12/23/17  Malachy Mood, MD  prochlorperazine (COMPAZINE) 10 MG tablet Take 1 tablet (10 mg total) by mouth every 6 (six) hours as needed for nausea or vomiting (headache). 10/02/17   Malachy Mood, MD  promethazine (PHENERGAN) 25 MG tablet Take 1/2 to 1 tablet po every 6 hours as needed for nausea 09/05/17   Gae Dry, MD  promethazine (PHENERGAN) 25 MG tablet Take 1 tablet (25 mg total) by mouth every 6 (six) hours as needed for nausea or vomiting. 12/23/17   Fisher, Linden Dolin, PA-C  sertraline (ZOLOFT) 50 MG tablet Take 1 tablet (50 mg total) daily by mouth. 08/27/17   Malachy Mood, MD    Allergies Azithromycin; Eletriptan hydrobromide; Pseudoephedrine; and Tape  Family History  Problem Relation Age of Onset  . Hypertension Mother     Social History Social History   Tobacco Use  . Smoking status: Never Smoker  . Smokeless tobacco: Never Used  Substance Use Topics  . Alcohol use: No  . Drug use: No    Review of Systems  Constitutional: Positive fever/chills Eyes: No visual changes. ENT: No sore throat. Respiratory: Positive cough, no chest pain or shortness of Gastrointestinal: Positive for nausea but  no vomiting Genitourinary: Negative for dysuria. Musculoskeletal: Negative for back pain. Skin: Negative for rash.    ____________________________________________   PHYSICAL EXAM:  VITAL SIGNS: ED Triage Vitals [12/23/17 1203]  Enc Vitals Group     BP 135/75     Pulse Rate (!) 110     Resp 20     Temp 99.7 F (37.6 C)     Temp Source Oral     SpO2 99 %     Weight 270 lb (122.5 kg)     Height _0  (1.575 m)     Head Circumference      Peak Flow      Pain Score 10      Pain Loc      Pain Edu?      Excl. in [place]?     Constitutional: Alert and oriented. Well appearing and in no acute distress. Eyes: Conjunctivae are normal.  Head: Atraumatic. Nose: No congestion/rhinnorhea. Mouth/Throat: Mucous membranes are moist.  Throat is normal Cardiovascular: Normal rate, regular rhythm.  Heart rate is elevated at 110 Respiratory: Normal respiratory effort.  No retractions, lungs are clear to auscultation Abdomen: Soft nontender bowel sounds normal GU: deferred Musculoskeletal: FROM all extremities, warm and well perfused Neurologic:  Normal speech and language.  Skin:  Skin is warm, dry and intact. No rash noted. Psychiatric: Mood and affect are normal. Speech and behavior are normal.  ____________________________________________   LABS (all labs ordered are listed, but only abnormal results are displayed)  Labs Reviewed  INFLUENZA PANEL BY PCR (TYPE A & B) - Abnormal; Notable for the following components:      Result Value   Influenza A By PCR POSITIVE (*)    All other components within normal limits  CBC WITH DIFFERENTIAL/PLATELET - Abnormal; Notable for the following components:   RDW 14.6 (*)    Lymphs Abs 0.2 (*)    All other components within normal limits  BASIC METABOLIC PANEL - Abnormal; Notable for the following components:   Sodium 130 (*)    Glucose, Bld 110 (*)    Calcium 8.0 (*)    All other components within normal limits   ____________________________________________   ____________________________________________  RADIOLOGY    ____________________________________________   PROCEDURES  Procedure(s) performed: Saline lock, bolus normal saline 1 L IV, Zofran 4 mg IV  Procedures    ____________________________________________   INITIAL IMPRESSION / ASSESSMENT AND PLAN / ED COURSE  Pertinent labs & imaging results that were available during my care of the patient were reviewed by me and considered in my medical decision  making (see chart for details).  Patient is a 32 year old female who is [redacted] weeks pregnant complaining of flulike symptoms.  She has been nauseated and unable to keep fluids down.  She states her daughter was positive for flu last week and she was given a Tamiflu preventive medication but it made her so nauseated she cannot take medication.  On physical exam she is nontoxic.  However she does look like she does not feel well.  Flu test ordered, saline lock, normal saline bolus 1 L IV, Zofran 4 mg IV, CBC and met B    ----------------------------------------- 1:30 PM on 12/23/2017 -----------------------------------------  Patient is positive for influenza A, CBC is normal and met B are normal.  Patient states she would rather have Phenergan instead of Zofran as a discharge medication as the Zofran constipates her.  Agree that that can be an appropriate medication.  She is to drink  plenty of fluids when she returns home.  Tamiflu is making her feel bad she could discontinue this as it only helps with the symptoms.  If it is making her feel worse than it is no reason to take medication.  She agrees with this plan.  They are to return to emergency department if she is worsening.  States she is already able to hold some crackers down and is feeling better.  She was discharged in stable condition  As part of my medical decision making, I reviewed the following data within the [place] notes reviewed and incorporated, Labs reviewed flu is positive for NA, CBC and met B are basically normal, Notes from prior ED visits and [place] Controlled Substance Database  ____________________________________________   FINAL CLINICAL IMPRESSION(S) / ED DIAGNOSES  Final diagnoses:  Influenza A      NEW MEDICATIONS STARTED DURING THIS VISIT:  New Prescriptions   PROMETHAZINE (PHENERGAN) 25 MG TABLET    Take 1 tablet (25 mg total) by mouth every 6 (six) hours as needed for nausea or  vomiting.     Note:  This document was prepared using Dragon voice recognition software and may include unintentional dictation errors.    Versie Starks, PA-C 12/23/17 1344    Earleen Newport, MD 12/23/17 1356

## 2017-12-23 NOTE — Discharge Instructions (Signed)
Follow-up with your regular doctor if you are not better in 3-5 days.  Use the medication as prescribed.  Increase your fluid intake to stay well-hydrated while having flu symptoms.  You can use an over-the-counter cold medicine that is approved by your GYN doctor.

## 2017-12-25 ENCOUNTER — Ambulatory Visit (INDEPENDENT_AMBULATORY_CARE_PROVIDER_SITE_OTHER): Payer: 59

## 2017-12-25 ENCOUNTER — Ambulatory Visit (INDEPENDENT_AMBULATORY_CARE_PROVIDER_SITE_OTHER): Payer: 59 | Admitting: Obstetrics & Gynecology

## 2017-12-25 VITALS — BP 122/80 | Wt 287.0 lb

## 2017-12-25 DIAGNOSIS — O0990 Supervision of high risk pregnancy, unspecified, unspecified trimester: Secondary | ICD-10-CM

## 2017-12-25 DIAGNOSIS — Z98891 History of uterine scar from previous surgery: Secondary | ICD-10-CM

## 2017-12-25 DIAGNOSIS — Z3A23 23 weeks gestation of pregnancy: Secondary | ICD-10-CM

## 2017-12-25 DIAGNOSIS — Z8759 Personal history of other complications of pregnancy, childbirth and the puerperium: Secondary | ICD-10-CM

## 2017-12-25 DIAGNOSIS — IMO0002 Reserved for concepts with insufficient information to code with codable children: Secondary | ICD-10-CM

## 2017-12-25 DIAGNOSIS — Z0489 Encounter for examination and observation for other specified reasons: Secondary | ICD-10-CM

## 2017-12-25 DIAGNOSIS — O99210 Obesity complicating pregnancy, unspecified trimester: Secondary | ICD-10-CM

## 2017-12-25 NOTE — Progress Notes (Signed)
  Subjective  Fetal Movement? yes Contractions? no Leaking Fluid? no Vaginal Bleeding? no Flu confirmed few days ago, intolerent to Tamiflu.  Getting better Objective  BP 122/80   Wt 287 lb (130.2 kg)   LMP 07/12/2017   BMI 52.49 kg/m  General: NAD Pumonary: no increased work of breathing Abdomen: gravid, non-tender Extremities: no edema Psychiatric: mood appropriate, affect full  Assessment  32 y.o. [id number] at 5958w5d by  04/18/2018, by Last Menstrual Period presenting for routine prenatal visit  Plan   Problem List Items Addressed This Visit      Other   History of gestational hypertension   History of 3 cesarean sections   Supervision of high risk pregnancy, antepartum   Maternal obesity, antepartum    Other Visit Diagnoses    [redacted] weeks gestation of pregnancy    -  Primary   Relevant Orders   28 Week RH+Panel    Plan CS after 39 weeks (sch nv) Hydrate and monitor flu sx's Glucola nv  Annamarie MajorPaul Maryssa Giampietro, MD, FACOG [facility], [facility] 12/25/2017  11:28 AM

## 2017-12-25 NOTE — Patient Instructions (Signed)

## 2017-12-30 ENCOUNTER — Telehealth: Payer: Self-pay | Admitting: Obstetrics & Gynecology

## 2017-12-30 ENCOUNTER — Other Ambulatory Visit: Payer: Self-pay | Admitting: Obstetrics & Gynecology

## 2017-12-30 ENCOUNTER — Encounter: Payer: Self-pay | Admitting: Obstetrics & Gynecology

## 2017-12-30 NOTE — Telephone Encounter (Signed)
Patient is aware of appointment w/ Anesthesia on Tuesday, 01/28/18 @ 1:30pm. Patient is aware of location.

## 2018-01-22 ENCOUNTER — Other Ambulatory Visit: Payer: 59

## 2018-01-22 ENCOUNTER — Ambulatory Visit (INDEPENDENT_AMBULATORY_CARE_PROVIDER_SITE_OTHER): Payer: 59 | Admitting: Obstetrics and Gynecology

## 2018-01-22 VITALS — BP 116/70 | Wt 287.0 lb

## 2018-01-22 DIAGNOSIS — Z3A27 27 weeks gestation of pregnancy: Secondary | ICD-10-CM

## 2018-01-22 DIAGNOSIS — Z3A23 23 weeks gestation of pregnancy: Secondary | ICD-10-CM

## 2018-01-22 DIAGNOSIS — O0990 Supervision of high risk pregnancy, unspecified, unspecified trimester: Secondary | ICD-10-CM

## 2018-01-22 DIAGNOSIS — Z8759 Personal history of other complications of pregnancy, childbirth and the puerperium: Secondary | ICD-10-CM

## 2018-01-22 DIAGNOSIS — Z98891 History of uterine scar from previous surgery: Secondary | ICD-10-CM

## 2018-01-22 DIAGNOSIS — O99210 Obesity complicating pregnancy, unspecified trimester: Secondary | ICD-10-CM

## 2018-01-22 NOTE — Progress Notes (Signed)
Routine Prenatal Care Visit  Subjective  Mary Campos is a 32 y.o. [phone] at 6460w5d being seen today for ongoing prenatal care.  She is currently monitored for the following issues for this high-risk pregnancy and has ANXIETY; ASTHMA, MILD, INTERMITTENT; History of gestational hypertension; History of 3 cesarean sections; Supervision of high risk pregnancy, antepartum; and Maternal obesity, antepartum on their problem list.  ----------------------------------------------------------------------------------- Patient reports no complaints.   Contractions: Not present. Vag. Bleeding: None.  Movement: Present. Denies leaking of fluid.  ----------------------------------------------------------------------------------- The following portions of the patient's history were reviewed and updated as appropriate: allergies, current medications, past family history, past medical history, past social history, past surgical history and problem list. Problem list updated.   Objective  Blood pressure 116/70, weight 287 lb (130.2 kg), last menstrual period 07/12/2017, not currently breastfeeding. Pregravid weight 269 lb (122 kg) Total Weight Gain 18 lb (8.165 kg) Urinalysis: Urine Protein: Negative Urine Glucose: Negative  Fetal Status: Fetal Heart Rate (bpm): 145   Movement: Present     General:  Alert, oriented and cooperative. Patient is in no acute distress.  Skin: Skin is warm and dry. No rash noted.   Cardiovascular: Normal heart rate noted  Respiratory: Normal respiratory effort, no problems with respiration noted  Abdomen: Soft, gravid, appropriate for gestational age. Pain/Pressure: Absent     Pelvic:  Cervical exam deferred        Extremities: Normal range of motion.     ental Status: Normal mood and affect. Normal behavior. Normal judgment and thought content.     Assessment   32 y.o. G4P3003 at 7960w5d by  04/18/2018, by Last Menstrual Period presenting for routine prenatal  visit  Plan   Pregnancy #4 Problems (from 07/12/17 to present)    Problem Noted Resolved   Supervision of high risk pregnancy, antepartum 08/29/2017 by Vena AustriaStaebler, Keyonta Madrid, MD No   Overview Addendum 11/27/2017  2:14 PM by Vena AustriaStaebler, Olga Seyler, MD    [facility]inic [facility] Prenatal Labs  Dating LMP = 7 week US Blood type: A positive  Genetic Screen Declines Antibody: negative  Anatomic US Female incomplete [X]  F/U 24-weeks complete Rubella: Immune Varicella: Immune  GTT Early: 5974              Third trimester:  RPR: NR  Rhogam N/A HBsAg: negative  TDaP vaccine                  Flu Shot:08/27/17 HIV: negative  Baby Food                                GBS:   Contraception  Pap: 2017 NIL  CBB     CS/VBAC Repeat C-section   Support Person            Maternal obesity, antepartum 08/29/2017 by Vena AustriaStaebler, Zyshonne Malecha, MD No   Overview Signed 08/29/2017 10:49 PM by Vena AustriaStaebler, Tanajah Boulter, MD    BMI >=40 [X]  early 1h gtt -  [X]  u/s for dating [ ]   [ ]  nutritional goals [X]  folic acid 1mg  [ ]  bASA (>12 weeks) [ ]  consider nutrition consult [ ]  consider maternal EKG 1st trimester [ ]  Growth u/s 28 [ ] , 32 [ ] , 36 weeks [ ]  [ ]  NST/AFI weekly 36+ weeks (36[] , 37[] , 38[] , 39[] , 40[] ) [ ]  IOL by 41 weeks (scheduled, prn [] )      History of gestational hypertension 08/26/2017 by Vena AustriaStaebler, Kanon Novosel, MD No  Overview Signed 08/29/2017 10:50 PM by Vena Austria, MD    Baseline and surveillance labs (pulled in from [facility], refresh links as needed)  Lab Results  Component Value Date   PLT 341 01/28/2017   CREATININE 0.89 01/28/2017   AST 17 06/04/2016   ALT 16 06/04/2016   PROTCRRATIO 0.13 06/04/2016          History of 3 cesarean sections 08/26/2017 by Vena Austria, MD No       Gestational age appropriate obstetric precautions including but not limited to vaginal bleeding, contractions, leaking of fluid and fetal movement were reviewed in detail with the patient.   - growth scan ordered for next  visit - 28 week labs today - Rh positive rhogam not indicated - Look at 04/11/18 for C-section with Tiburcio Pea and myself  Return in about 2 weeks (around 02/05/2018) for ROB and growth scan.  Vena Austria, MD, Evern Core [facility], [facility] 01/22/2018, 10:21 AM

## 2018-01-22 NOTE — Progress Notes (Signed)
ROB 28 week GTT 

## 2018-01-23 ENCOUNTER — Telehealth: Payer: Self-pay | Admitting: Obstetrics & Gynecology

## 2018-01-23 LAB — 28 WEEK RH+PANEL
BASOS ABS: 0 10*3/uL (ref 0.0–0.2)
BASOS: 0 %
EOS (ABSOLUTE): 0.1 10*3/uL (ref 0.0–0.4)
Eos: 1 %
GESTATIONAL DIABETES SCREEN: 110 mg/dL (ref 65–139)
HIV Screen 4th Generation wRfx: NONREACTIVE
Hematocrit: 37.1 % (ref 34.0–46.6)
Hemoglobin: 12.6 g/dL (ref 11.1–15.9)
IMMATURE GRANULOCYTES: 0 %
Immature Grans (Abs): 0 10*3/uL (ref 0.0–0.1)
LYMPHS: 21 %
Lymphocytes Absolute: 1.7 10*3/uL (ref 0.7–3.1)
MCH: 30.4 pg (ref 26.6–33.0)
MCHC: 34 g/dL (ref 31.5–35.7)
MCV: 90 fL (ref 79–97)
MONOS ABS: 0.4 10*3/uL (ref 0.1–0.9)
Monocytes: 5 %
NEUTROS PCT: 73 %
Neutrophils Absolute: 5.9 10*3/uL (ref 1.4–7.0)
PLATELETS: 262 10*3/uL (ref 150–379)
RBC: 4.14 x10E6/uL (ref 3.77–5.28)
RDW: 15 % (ref 12.3–15.4)
RPR: NONREACTIVE
WBC: 8.1 10*3/uL (ref 3.4–10.8)

## 2018-01-23 NOTE — Telephone Encounter (Signed)
-----   Message from Nadara Mustardobert P Harris, MD sent at 01/23/2018  7:51 AM EDT ----- Regarding: Surgery Schedule and request 12:00 time for Jun 21 Friday  Surgery Booking Request Patient Full Name:  Mary Campos  MRN: [id number]  DOB: 10/13/1986  Surgeon: Letitia Libraobert [name] Harris, MD  Requested Surgery Date and Time: 04/11/18 12:00 Primary Diagnosis AND Code: Term pregnancy, Prior CS Secondary Diagnosis and Code: Obesity Surgical Procedure: Cesarean Section L&D Notification: Yes Admission Status: surgery admit Length of Surgery: 1 hr Special Case Needs: no H&P: yes (date) Phone Interview???: no Interpreter: Language:  Medical Clearance: no Special Scheduling Instructions: no

## 2018-01-23 NOTE — Telephone Encounter (Signed)
Lmtrc

## 2018-01-24 NOTE — Telephone Encounter (Signed)
Patient is aware of [facility] at [facility] on 04/10/18 @ 9:40am, Pre-admit Testing afterwards, and OR on 04/11/18.

## 2018-01-27 ENCOUNTER — Encounter: Payer: Self-pay | Admitting: Obstetrics and Gynecology

## 2018-01-27 ENCOUNTER — Other Ambulatory Visit: Payer: Self-pay | Admitting: Obstetrics and Gynecology

## 2018-01-27 MED ORDER — ALBUTEROL SULFATE 0.63 MG/3ML IN NEBU: 1.0000 | INHALATION_SOLUTION | Freq: Four times a day (QID) | RESPIRATORY_TRACT | 0 refills | Status: DC | PRN

## 2018-01-27 MED ORDER — ALBUTEROL SULFATE HFA 108 (90 BASE) MCG/ACT IN AERS
2.0000 | INHALATION_SPRAY | RESPIRATORY_TRACT | 3 refills | Status: DC | PRN
Start: 2018-01-27 — End: 2020-04-20

## 2018-01-28 ENCOUNTER — Encounter: Payer: Self-pay | Admitting: Obstetrics and Gynecology

## 2018-01-28 ENCOUNTER — Other Ambulatory Visit: Payer: 59

## 2018-01-30 ENCOUNTER — Encounter
Admission: RE | Admit: 2018-01-30 | Discharge: 2018-01-30 | Disposition: A | Discharge status: Home / Self Care | Payer: 59 | Source: Ambulatory Visit | Attending: Anesthesiology | Admitting: Anesthesiology

## 2018-01-30 NOTE — Consult Note (Signed)
Anesthesiology consult note (Ambulatory referral to OB anesthesia for morbid obesity) :       I had the distinct pleasure of meeting Mary GranaJamie Sunderland today for an [facility] precheck.      She has a history of super morbid obesity with a BMI today of 52.49 and her estimated deliver date is 6/28.  She has a MP score of 3 and a short thyromental distance giving her predictors of a difficult airway.    Because of these high risk factors she does not have clearance from [facility] to deliver at this facility.  We feel that she would receive more appropriate care at a high risk obstetric facility.  This was explained to the patient who voiced understanding.  This patient who was discussed with Dr. Hal Hopearrol and Henrene HawkingKephart who agreed with this plan.

## 2018-02-06 ENCOUNTER — Ambulatory Visit (INDEPENDENT_AMBULATORY_CARE_PROVIDER_SITE_OTHER): Payer: 59

## 2018-02-06 ENCOUNTER — Ambulatory Visit (INDEPENDENT_AMBULATORY_CARE_PROVIDER_SITE_OTHER): Payer: 59 | Admitting: Obstetrics & Gynecology

## 2018-02-06 ENCOUNTER — Encounter: Payer: Self-pay | Admitting: Obstetrics & Gynecology

## 2018-02-06 VITALS — BP 128/80 | Wt 290.0 lb

## 2018-02-06 DIAGNOSIS — O99210 Obesity complicating pregnancy, unspecified trimester: Secondary | ICD-10-CM | POA: Diagnosis not present

## 2018-02-06 DIAGNOSIS — Z8759 Personal history of other complications of pregnancy, childbirth and the puerperium: Secondary | ICD-10-CM

## 2018-02-06 DIAGNOSIS — O09212 Supervision of pregnancy with history of pre-term labor, second trimester: Secondary | ICD-10-CM

## 2018-02-06 DIAGNOSIS — Z3A29 29 weeks gestation of pregnancy: Secondary | ICD-10-CM

## 2018-02-06 DIAGNOSIS — Z3A27 27 weeks gestation of pregnancy: Secondary | ICD-10-CM

## 2018-02-06 DIAGNOSIS — Z98891 History of uterine scar from previous surgery: Secondary | ICD-10-CM

## 2018-02-06 DIAGNOSIS — O0990 Supervision of high risk pregnancy, unspecified, unspecified trimester: Secondary | ICD-10-CM

## 2018-02-06 MED ORDER — TETANUS-DIPHTH-ACELL PERTUSSIS 5-2.5-18.5 LF-MCG/0.5 IM SUSP
0.5000 mL | Freq: Once | INTRAMUSCULAR | Status: AC
  Administered 2018-02-06: 0.5 mL via INTRAMUSCULAR

## 2018-02-06 NOTE — Addendum Note (Signed)
Addended by: Cornelius MorasPATTERSON, Sianna Garofano D on: 02/06/2018 11:47 AM   Modules accepted: Orders

## 2018-02-06 NOTE — Progress Notes (Signed)
  Subjective  Fetal Movement? yes Contractions? no Leaking Fluid? no Vaginal Bleeding? no Recent ANES consult, not able [facility].  Discussed options, [facility] [place], will arrange transfer of care.  Pt wishes to return to practice fro post partum care.   Uncertain birth control plans at this time.  Not interested in BTL.  Counseled on risks of future pregnancy with multiple CS in her past. Objective  BP 128/80   Wt 290 lb (131.5 kg)   LMP 07/12/2017   BMI 53.04 kg/m  General: NAD Pumonary: no increased work of breathing Abdomen: gravid, non-tender Extremities: no edema Psychiatric: mood appropriate, affect full  Assessment  32 y.o. [id number] at 4565w6d by  04/18/2018, by Last Menstrual Period presenting for routine prenatal visit  Plan   Problem List Items Addressed This Visit      Other   Supervision of high risk pregnancy, antepartum   Maternal obesity, antepartum   RESOLVED: History of 2 cesarean sections    Other Visit Diagnoses    [redacted] weeks gestation of pregnancy    -  Primary    Plans CS, cannot deliver ar [facility] per Anesthesia risk factors Uncertain pp bc Breast feeding plans Discussed   Annamarie MajorPaul Khylee Algeo, MD, Merlinda FrederickFACOG [facility], [facility] 02/06/2018  11:07 AM

## 2018-02-11 ENCOUNTER — Encounter: Payer: Self-pay | Admitting: Obstetrics & Gynecology

## 2018-02-20 ENCOUNTER — Encounter: Payer: 59 | Admitting: Obstetrics & Gynecology

## 2018-02-21 ENCOUNTER — Ambulatory Visit (INDEPENDENT_AMBULATORY_CARE_PROVIDER_SITE_OTHER): Payer: 59 | Admitting: Obstetrics and Gynecology

## 2018-02-21 VITALS — BP 124/74 | HR 74 | Wt 292.2 lb

## 2018-02-21 DIAGNOSIS — Z98891 History of uterine scar from previous surgery: Secondary | ICD-10-CM

## 2018-02-21 DIAGNOSIS — F411 Generalized anxiety disorder: Secondary | ICD-10-CM

## 2018-02-21 DIAGNOSIS — Z6841 Body Mass Index (BMI) 40.0 and over, adult: Secondary | ICD-10-CM | POA: Insufficient documentation

## 2018-02-21 DIAGNOSIS — O0990 Supervision of high risk pregnancy, unspecified, unspecified trimester: Secondary | ICD-10-CM

## 2018-02-21 DIAGNOSIS — Z8759 Personal history of other complications of pregnancy, childbirth and the puerperium: Secondary | ICD-10-CM

## 2018-02-21 DIAGNOSIS — J4520 Mild intermittent asthma, uncomplicated: Secondary | ICD-10-CM

## 2018-02-21 DIAGNOSIS — O99210 Obesity complicating pregnancy, unspecified trimester: Secondary | ICD-10-CM

## 2018-02-23 NOTE — Progress Notes (Signed)
Prenatal Visit Note Date: 02/23/2018 Clinic: [facility]  Transfer Visit from [place]  Subjective:  Mary Campos is a 32 y.o. [phone] at [redacted]w[redacted]d being seen today for ongoing prenatal care.  She is currently monitored for the following issues for this high-risk pregnancy and has Anxiety state; Asthma; History of gestational hypertension; History of 3 cesarean sections; Supervision of high risk pregnancy, antepartum; Maternal obesity, antepartum; and BMI 50.0-59.9, adult (HCC) on their problem list.  Patient reports no complaints.   Contractions: Not present. Vag. Bleeding: None.  Movement: Present. Denies leaking of fluid.   The following portions of the patient's history were reviewed and updated as appropriate: allergies, current medications, past family history, past medical history, past social history, past surgical history and problem list. Problem list updated.  Objective:   Vitals:   02/21/18 1051  BP: 124/74  Pulse: 74  Weight: 292 lb 3.2 oz (132.5 kg)    Fetal Status: Fetal Heart Rate (bpm): 141 Fundal Height: 34 cm Movement: Present     General:  Alert, oriented and cooperative. Patient is in no acute distress.  Skin: Skin is warm and dry. No rash noted.   Cardiovascular: Normal heart rate noted  Respiratory: Normal respiratory effort, no problems with respiration noted  Abdomen: Soft, gravid, appropriate for gestational age. Pain/Pressure: Absent     Pelvic:  Cervical exam deferred        Extremities: Normal range of motion.  Edema: None  Mental Status: Normal mood and affect. Normal behavior. Normal judgment and thought content.   Urinalysis:      Assessment and Plan:  Pregnancy: G4P3003 at [redacted]w[redacted]d  1. Supervision of high risk pregnancy, antepartum Unfortunately patient had to transfer from primary OBGYN due to her weight being BMI 50 or above. Pt doesn't desire BTL. Routine care.   2. BMI 50.0-59.9, adult (HCC)  3. Anxiety  state Continue zoloft. Recommend 10-14d incision check and PP depression screening  4. Mild intermittent asthma without complication Doing well on no meds  5. History of gestational hypertension Continue low dose ASA  6. History of 3 cesarean sections Prior op note reviewed and thin LUS but no scar tissue seen. Pt set up for 6/21 c/s with me @ 39/0  7. Maternal obesity, antepartum See above  Preterm labor symptoms and general obstetric precautions including but not limited to vaginal bleeding, contractions, leaking of fluid and fetal movement were reviewed in detail with the patient. Please refer to After Visit Summary for other counseling recommendations.  Return in about 2 weeks (around 03/07/2018) for with dr Vergie Living. hrob.   [place] Bing, MD

## 2018-02-24 ENCOUNTER — Encounter (HOSPITAL_COMMUNITY): Payer: Self-pay

## 2018-03-06 ENCOUNTER — Ambulatory Visit (INDEPENDENT_AMBULATORY_CARE_PROVIDER_SITE_OTHER): Payer: 59 | Admitting: Obstetrics and Gynecology

## 2018-03-06 VITALS — BP 107/73 | HR 75 | Wt 295.2 lb

## 2018-03-06 DIAGNOSIS — O99213 Obesity complicating pregnancy, third trimester: Secondary | ICD-10-CM

## 2018-03-06 DIAGNOSIS — O0990 Supervision of high risk pregnancy, unspecified, unspecified trimester: Secondary | ICD-10-CM

## 2018-03-06 DIAGNOSIS — O34219 Maternal care for unspecified type scar from previous cesarean delivery: Secondary | ICD-10-CM

## 2018-03-06 DIAGNOSIS — F4321 Adjustment disorder with depressed mood: Secondary | ICD-10-CM

## 2018-03-06 DIAGNOSIS — F411 Generalized anxiety disorder: Secondary | ICD-10-CM

## 2018-03-06 DIAGNOSIS — Z6841 Body Mass Index (BMI) 40.0 and over, adult: Secondary | ICD-10-CM

## 2018-03-06 DIAGNOSIS — O99210 Obesity complicating pregnancy, unspecified trimester: Secondary | ICD-10-CM

## 2018-03-06 DIAGNOSIS — Z98891 History of uterine scar from previous surgery: Secondary | ICD-10-CM

## 2018-03-06 DIAGNOSIS — O0993 Supervision of high risk pregnancy, unspecified, third trimester: Secondary | ICD-10-CM

## 2018-03-06 DIAGNOSIS — Z8759 Personal history of other complications of pregnancy, childbirth and the puerperium: Secondary | ICD-10-CM

## 2018-03-06 MED ORDER — SERTRALINE HCL 50 MG PO TABS: 50.0000 mg | ORAL_TABLET | Freq: Every day | ORAL | 2 refills | Status: DC

## 2018-03-06 NOTE — Progress Notes (Signed)
Prenatal Visit Note Date: 03/06/2018 Clinic: [facility]  Subjective:  Mary Campos is a 32 y.o. [phone] at [redacted]w[redacted]d being seen today for ongoing prenatal care.  She is currently monitored for the following issues for this high-risk pregnancy and has Anxiety state; Asthma; History of gestational hypertension; History of 3 cesarean sections; Supervision of high risk pregnancy, antepartum; Maternal obesity, antepartum; and BMI 50.0-59.9, adult (HCC) on their problem list.  Patient reports no complaints.   Contractions: Not present. Vag. Bleeding: None.  Movement: Present. Denies leaking of fluid.   The following portions of the patient's history were reviewed and updated as appropriate: allergies, current medications, past family history, past medical history, past social history, past surgical history and problem list. Problem list updated.  Objective:   Vitals:   03/06/18 1054  BP: 107/73  Pulse: 75  Weight: 295 lb 3.2 oz (133.9 kg)    Fetal Status: Fetal Heart Rate (bpm): 141 Fundal Height: 36 cm Movement: Present  Presentation: Vertex  General:  Alert, oriented and cooperative. Patient is in no acute distress.  Skin: Skin is warm and dry. No rash noted.   Cardiovascular: Normal heart rate noted  Respiratory: Normal respiratory effort, no problems with respiration noted  Abdomen: Soft, gravid, appropriate for gestational age. Pain/Pressure: Absent     Pelvic:  Cervical exam deferred        Extremities: Normal range of motion.  Edema: None  Mental Status: Normal mood and affect. Normal behavior. Normal judgment and thought content.   Urinalysis:      Assessment and Plan:  Pregnancy: G4P3003 at [redacted]w[redacted]d  1. BMI 50.0-59.9, adult (HCC) Routine care. D/w her to keep eye on weight  2. Maternal obesity, antepartum See above  3. Supervision of high risk pregnancy, antepartum Not interested in BTL. gbs nv  4. History of 3 cesarean sections Rpt scheduled for 6/21  with me  5. History of gestational hypertension Continue low dose asa  6. Anxiety state Continue zoloft.   Preterm labor symptoms and general obstetric precautions including but not limited to vaginal bleeding, contractions, leaking of fluid and fetal movement were reviewed in detail with the patient. Please refer to After Visit Summary for other counseling recommendations.  Return in about 10 days (around 03/16/2018) for rob with me.   [place] Bing, MD

## 2018-03-14 ENCOUNTER — Ambulatory Visit (INDEPENDENT_AMBULATORY_CARE_PROVIDER_SITE_OTHER): Payer: 59 | Admitting: Obstetrics and Gynecology

## 2018-03-14 VITALS — BP 102/64 | HR 85 | Wt 295.6 lb

## 2018-03-14 DIAGNOSIS — O0990 Supervision of high risk pregnancy, unspecified, unspecified trimester: Secondary | ICD-10-CM

## 2018-03-14 DIAGNOSIS — F4321 Adjustment disorder with depressed mood: Secondary | ICD-10-CM

## 2018-03-14 DIAGNOSIS — O0993 Supervision of high risk pregnancy, unspecified, third trimester: Secondary | ICD-10-CM

## 2018-03-14 MED ORDER — SERTRALINE HCL 50 MG PO TABS: 50.0000 mg | ORAL_TABLET | Freq: Every day | ORAL | 2 refills | Status: DC

## 2018-03-14 NOTE — Progress Notes (Signed)
Prenatal Visit Note Date: 03/14/2018 Clinic: [facility]  Subjective:  Mary Campos is a 32 y.o. [phone] at [redacted]w[redacted]d being seen today for ongoing prenatal care.  She is currently monitored for the following issues for this high-risk pregnancy and has Anxiety state; Asthma; History of gestational hypertension; History of 3 cesarean sections; Supervision of high risk pregnancy, antepartum; Maternal obesity, antepartum; and BMI 50.0-59.9, adult (HCC) on their problem list.  Patient reports no complaints.   Contractions: Irregular. Vag. Bleeding: None.  Movement: Present. Denies leaking of fluid.   The following portions of the patient's history were reviewed and updated as appropriate: allergies, current medications, past family history, past medical history, past social history, past surgical history and problem list. Problem list updated.  Objective:   Vitals:   03/14/18 1119  BP: 102/64  Pulse: 85  Weight: 295 lb 9.6 oz (134.1 kg)    Fetal Status: Fetal Heart Rate (bpm): 140 Fundal Height: 35 cm Movement: Present  Presentation: Vertex  General:  Alert, oriented and cooperative. Patient is in no acute distress.  Skin: Skin is warm and dry. No rash noted.   Cardiovascular: Normal heart rate noted  Respiratory: Normal respiratory effort, no problems with respiration noted  Abdomen: Soft, gravid, appropriate for gestational age. Pain/Pressure: Present     Pelvic:  Cervical exam deferred        Extremities: Normal range of motion.  Edema: Trace  Mental Status: Normal mood and affect. Normal behavior. Normal judgment and thought content.   Urinalysis:      Assessment and Plan:  Pregnancy: G4P3003 at [redacted]w[redacted]d  1. BMI 50.0-59.9, adult (HCC) Routine care. Weight stable  2. Maternal obesity, antepartum See above  3. Supervision of high risk pregnancy, antepartum Not interested in BTL. gbs nv  4. History of 3 cesarean sections Rpt scheduled for 6/21 with  me  5. History of gestational hypertension Continue low dose asa  6. Anxiety state Pharmacy didn't get med. Printed Rx given to her - sertraline (ZOLOFT) 50 MG tablet; Take 1 tablet (50 mg total) by mouth daily.  Dispense: 60 tablet; Refill: 2  Preterm labor symptoms and general obstetric precautions including but not limited to vaginal bleeding, contractions, leaking of fluid and fetal movement were reviewed in detail with the patient. Please refer to After Visit Summary for other counseling recommendations.  Return in about 1 week (around 03/21/2018) for rob with me.   [place] Bing, MD

## 2018-03-27 ENCOUNTER — Encounter: Payer: 59 | Admitting: Obstetrics and Gynecology

## 2018-03-27 ENCOUNTER — Telehealth (HOSPITAL_COMMUNITY): Payer: Self-pay | Admitting: *Deleted

## 2018-03-27 NOTE — Telephone Encounter (Signed)
Preadmission screen  

## 2018-03-31 ENCOUNTER — Telehealth (HOSPITAL_COMMUNITY): Payer: Self-pay | Admitting: *Deleted

## 2018-03-31 ENCOUNTER — Encounter: Payer: Self-pay | Admitting: Obstetrics and Gynecology

## 2018-03-31 NOTE — Telephone Encounter (Signed)
Preadmission screen  

## 2018-04-01 ENCOUNTER — Encounter (HOSPITAL_COMMUNITY): Payer: Self-pay

## 2018-04-03 ENCOUNTER — Ambulatory Visit (INDEPENDENT_AMBULATORY_CARE_PROVIDER_SITE_OTHER): Payer: 59 | Admitting: Obstetrics and Gynecology

## 2018-04-03 VITALS — BP 124/64 | HR 72 | Wt 293.8 lb

## 2018-04-03 DIAGNOSIS — O99213 Obesity complicating pregnancy, third trimester: Secondary | ICD-10-CM

## 2018-04-03 DIAGNOSIS — Z8759 Personal history of other complications of pregnancy, childbirth and the puerperium: Secondary | ICD-10-CM

## 2018-04-03 DIAGNOSIS — Z98891 History of uterine scar from previous surgery: Secondary | ICD-10-CM

## 2018-04-03 DIAGNOSIS — F411 Generalized anxiety disorder: Secondary | ICD-10-CM

## 2018-04-03 DIAGNOSIS — Z6841 Body Mass Index (BMI) 40.0 and over, adult: Secondary | ICD-10-CM

## 2018-04-03 DIAGNOSIS — O0993 Supervision of high risk pregnancy, unspecified, third trimester: Secondary | ICD-10-CM

## 2018-04-03 DIAGNOSIS — O99210 Obesity complicating pregnancy, unspecified trimester: Secondary | ICD-10-CM

## 2018-04-03 DIAGNOSIS — O0990 Supervision of high risk pregnancy, unspecified, unspecified trimester: Secondary | ICD-10-CM

## 2018-04-03 DIAGNOSIS — Z113 Encounter for screening for infections with a predominantly sexual mode of transmission: Secondary | ICD-10-CM

## 2018-04-03 NOTE — Progress Notes (Signed)
Prenatal Visit Note Date: 04/03/2018 Clinic: [facility]  Subjective:  Mary Campos is a 32 y.o. [phone] at 2473w6d being seen today for ongoing prenatal care.  She is currently monitored for the following issues for this high-risk pregnancy and has Anxiety state; Asthma; History of gestational hypertension; History of 3 cesarean sections; Supervision of high risk pregnancy, antepartum; Maternal obesity, antepartum; and BMI 50.0-59.9, adult (HCC) on their problem list.  Patient reports no complaints.   Contractions: Irregular. Vag. Bleeding: None.  Movement: Present. Denies leaking of fluid.   The following portions of the patient's history were reviewed and updated as appropriate: allergies, current medications, past family history, past medical history, past social history, past surgical history and problem list. Problem list updated.  Objective:   Vitals:   04/03/18 1109  BP: 124/64  Pulse: 72  Weight: 293 lb 12.8 oz (133.3 kg)    Fetal Status: Fetal Heart Rate (bpm): 140 Fundal Height: 37 cm Movement: Present  Presentation: Vertex  General:  Alert, oriented and cooperative. Patient is in no acute distress.  Skin: Skin is warm and dry. No rash noted.   Cardiovascular: Normal heart rate noted  Respiratory: Normal respiratory effort, no problems with respiration noted  Abdomen: Soft, gravid, appropriate for gestational age. Pain/Pressure: Present     Pelvic:  Cervical exam deferred        Extremities: Normal range of motion.  Edema: Trace  Mental Status: Normal mood and affect. Normal behavior. Normal judgment and thought content.   Urinalysis:      Assessment and Plan:  Pregnancy: G4P3003 at 7273w6d  1. BMI 50.0-59.9, adult (HCC) Routine care. Weight stable  2. Maternal obesity, antepartum See above  3. Supervision of high risk pregnancy, antepartum Not interested in BTL. gbs today.   4. History of 3 cesarean sections Rpt scheduled for 6/21 with  me  5. History of gestational hypertension Continue low dose asa  6. Anxiety state Doing well on zoloft  Term labor symptoms and general obstetric precautions including but not limited to vaginal bleeding, contractions, leaking of fluid and fetal movement were reviewed in detail with the patient. Please refer to After Visit Summary for other counseling recommendations.  Return in about 6 days (around 04/09/2018).   [place] BingPickens, Mary Blocher, MD

## 2018-04-03 NOTE — Progress Notes (Signed)
gc

## 2018-04-04 LAB — GC/CHLAMYDIA PROBE AMP (~~LOC~~) NOT AT ARMC
CHLAMYDIA, DNA PROBE: NEGATIVE
Neisseria Gonorrhea: NEGATIVE

## 2018-04-06 LAB — CULTURE, BETA STREP (GROUP B ONLY): STREP GP B CULTURE: NEGATIVE

## 2018-04-09 ENCOUNTER — Ambulatory Visit (INDEPENDENT_AMBULATORY_CARE_PROVIDER_SITE_OTHER): Payer: 59 | Admitting: Obstetrics and Gynecology

## 2018-04-09 VITALS — BP 132/77 | HR 72 | Wt 296.0 lb

## 2018-04-09 DIAGNOSIS — O0990 Supervision of high risk pregnancy, unspecified, unspecified trimester: Secondary | ICD-10-CM

## 2018-04-09 DIAGNOSIS — O99213 Obesity complicating pregnancy, third trimester: Secondary | ICD-10-CM | POA: Diagnosis not present

## 2018-04-09 DIAGNOSIS — Z8759 Personal history of other complications of pregnancy, childbirth and the puerperium: Secondary | ICD-10-CM | POA: Diagnosis not present

## 2018-04-09 DIAGNOSIS — Z6841 Body Mass Index (BMI) 40.0 and over, adult: Secondary | ICD-10-CM

## 2018-04-09 DIAGNOSIS — O0993 Supervision of high risk pregnancy, unspecified, third trimester: Secondary | ICD-10-CM | POA: Diagnosis not present

## 2018-04-09 DIAGNOSIS — Z98891 History of uterine scar from previous surgery: Secondary | ICD-10-CM | POA: Diagnosis not present

## 2018-04-09 DIAGNOSIS — O99210 Obesity complicating pregnancy, unspecified trimester: Secondary | ICD-10-CM

## 2018-04-09 DIAGNOSIS — F411 Generalized anxiety disorder: Secondary | ICD-10-CM | POA: Diagnosis not present

## 2018-04-09 NOTE — Progress Notes (Signed)
Prenatal Visit Note Date: 04/09/2018 Clinic: [facility]  Subjective:  Mary Campos is a 32 y.o. [phone] at 5618w5d being seen today for ongoing prenatal care.  She is currently monitored for the following issues for this high-risk pregnancy and has Anxiety state; Asthma; History of gestational hypertension; History of 3 cesarean sections; Supervision of high risk pregnancy, antepartum; Maternal obesity, antepartum; and BMI 50.0-59.9, adult (HCC) on their problem list.  Patient reports no complaints.   Contractions: Irregular. Vag. Bleeding: None.  Movement: Present. Denies leaking of fluid.   The following portions of the patient's history were reviewed and updated as appropriate: allergies, current medications, past family history, past medical history, past social history, past surgical history and problem list. Problem list updated.  Objective:   Vitals:   04/09/18 1028  BP: 132/77  Pulse: 72  Weight: 296 lb (134.3 kg)    Fetal Status: Fetal Heart Rate (bpm): 132 Fundal Height: 38 cm Movement: Present  Presentation: Vertex  General:  Alert, oriented and cooperative. Patient is in no acute distress.  Skin: Skin is warm and dry. No rash noted.   Cardiovascular: Normal heart rate noted  Respiratory: Normal respiratory effort, no problems with respiration noted  Abdomen: Soft, gravid, appropriate for gestational age. Pain/Pressure: Present     Pelvic:  Cervical exam deferred        Extremities: Normal range of motion.  Edema: Trace  Mental Status: Normal mood and affect. Normal behavior. Normal judgment and thought content.   Urinalysis:      Assessment and Plan:  Pregnancy: G4P3003 at 5418w5d  1. BMI 50.0-59.9, adult (HCC) Routine care.Weight stable  2. Maternal obesity, antepartum See above  3. Supervision of high risk pregnancy, antepartum No BTL. D/w her re: inpatient pp lovenox. Will try and do prevena.   4. History of 3 cesarean sections Rpt  scheduled for 6/21 with me  5. History of gestational hypertension Continue low dose asa  6. Anxiety state Doing well on zoloft  Term labor symptoms and general obstetric precautions including but not limited to vaginal bleeding, contractions, leaking of fluid and fetal movement were reviewed in detail with the patient. Please refer to After Visit Summary for other counseling recommendations.  Return in about 1 week (around 04/16/2018) for 7-8 d incisiion check.   [place] BingPickens, Baker [place], MD

## 2018-04-09 NOTE — Patient Instructions (Signed)
Mary GranaJamie Campos  04/09/2018   Your procedure is scheduled on:  04/11/2018  [facility]  Pick up the phone at the desk and dial [phone]  Call this number if you have problems the morning of [phone]  Remember:   Do not eat food:(After Midnight) Desps de medianoche.  Do not drink clear liquids: (After Midnight) Desps de medianoche.  Take these medicines the morning of surgery with A SIP OF WATER: none   Do not wear jewelry, make-up or nail polish.  Do not wear lotions, powders, or perfumes. Do not wear deodorant.  Do not shave 48 hours prior to surgery.  Do not bring valuables to the hospital.  [facility] is not   responsible for any belongings or valuables brought to the hospital.  Contacts, dentures or bridgework may not be worn into surgery.  Leave suitcase in the car. After surgery it may be brought to your room.  For patients admitted to the hospital, checkout time is 11:00 AM the day of              discharge.    N/A   Please read over the following fact sheets that you were given:   Surgical Site Infection Prevention

## 2018-04-10 ENCOUNTER — Encounter: Payer: 59 | Admitting: Obstetrics & Gynecology

## 2018-04-10 ENCOUNTER — Other Ambulatory Visit: Payer: Self-pay | Admitting: Obstetrics and Gynecology

## 2018-04-10 ENCOUNTER — Encounter (HOSPITAL_COMMUNITY)
Admission: RE | Admit: 2018-04-10 | Discharge: 2018-04-10 | Disposition: A | Discharge status: Home / Self Care | Payer: 59 | Source: Ambulatory Visit | Attending: Obstetrics and Gynecology | Admitting: Obstetrics and Gynecology

## 2018-04-10 ENCOUNTER — Inpatient Hospital Stay: Admission: RE | Admit: 2018-04-10 | Payer: 59 | Source: Ambulatory Visit

## 2018-04-10 HISTORY — DX: Depression, unspecified: F32.A

## 2018-04-10 HISTORY — DX: Major depressive disorder, single episode, unspecified: F32.9

## 2018-04-10 LAB — CBC
HEMATOCRIT: 35.8 % — AB (ref 36.0–46.0)
HEMOGLOBIN: 12.2 g/dL (ref 12.0–15.0)
MCH: 30.5 pg (ref 26.0–34.0)
MCHC: 34.1 g/dL (ref 30.0–36.0)
MCV: 89.5 fL (ref 78.0–100.0)
Platelets: 218 10*3/uL (ref 150–400)
RBC: 4 MIL/uL (ref 3.87–5.11)
RDW: 14.9 % (ref 11.5–15.5)
WBC: 6.5 10*3/uL (ref 4.0–10.5)

## 2018-04-10 LAB — TYPE AND SCREEN
ABO/RH(D): A POS
ANTIBODY SCREEN: NEGATIVE

## 2018-04-10 LAB — ABO/RH: ABO/RH(D): A POS

## 2018-04-10 NOTE — Anesthesia Preprocedure Evaluation (Signed)
Anesthesia Evaluation  Patient identified by MRN, date of birth, ID band Patient awake    Reviewed: Allergy & Precautions, H&P , NPO status , Patient's Chart, lab work & pertinent test results, reviewed documented beta blocker date and time   Airway Mallampati: III  TM Distance: >3 FB Neck ROM: full    Dental no notable dental hx. (+) Teeth Intact   Pulmonary neg pulmonary ROS, asthma ,    Pulmonary exam normal breath sounds clear to auscultation       Cardiovascular Exercise Tolerance: Good hypertension, On Medications negative cardio ROS Normal cardiovascular exam Rhythm:regular Rate:Normal     Neuro/Psych negative neurological ROS  negative psych ROS   GI/Hepatic negative GI ROS, Neg liver ROS,   Endo/Other  Morbid obesity  Renal/GU negative Renal ROS  negative genitourinary   Musculoskeletal   Abdominal   Peds  Hematology negative hematology ROS (+)   Anesthesia Other Findings   Reproductive/Obstetrics (+) Pregnancy                             Anesthesia Physical  Anesthesia Plan  ASA: III  Anesthesia Plan: Spinal   Post-op Pain Management:    Induction:   PONV Risk Score and Plan: Ondansetron, Dexamethasone and Treatment may vary due to age or medical condition  Airway Management Planned:   Additional Equipment:   Intra-op Plan:   Post-operative Plan:   Informed Consent: I have reviewed the patients History and Physical, chart, labs and discussed the procedure including the risks, benefits and alternatives for the proposed anesthesia with the patient or authorized representative who has indicated his/her understanding and acceptance.   Dental advisory given  Plan Discussed with: CRNA  Anesthesia Plan Comments:         Anesthesia Quick Evaluation

## 2018-04-11 ENCOUNTER — Inpatient Hospital Stay: Admit: 2018-04-11 | Payer: 59 | Admitting: Obstetrics & Gynecology

## 2018-04-11 ENCOUNTER — Encounter (HOSPITAL_COMMUNITY): Payer: Self-pay

## 2018-04-11 ENCOUNTER — Inpatient Hospital Stay (HOSPITAL_COMMUNITY): Payer: 59 | Admitting: Anesthesiology

## 2018-04-11 ENCOUNTER — Other Ambulatory Visit: Payer: Self-pay

## 2018-04-11 ENCOUNTER — Encounter (HOSPITAL_COMMUNITY)
Admission: AD | Disposition: A | Discharge status: Home / Self Care | Payer: Self-pay | Source: Home / Self Care | Attending: Obstetrics and Gynecology

## 2018-04-11 ENCOUNTER — Inpatient Hospital Stay (HOSPITAL_COMMUNITY)
Admission: AD | Admit: 2018-04-11 | Discharge: 2018-04-13 | DRG: 788 | Disposition: A | Discharge status: Home / Self Care | Payer: 59 | Attending: Obstetrics and Gynecology | Admitting: Obstetrics and Gynecology

## 2018-04-11 DIAGNOSIS — E669 Obesity, unspecified: Secondary | ICD-10-CM | POA: Diagnosis present

## 2018-04-11 DIAGNOSIS — Z98891 History of uterine scar from previous surgery: Secondary | ICD-10-CM

## 2018-04-11 DIAGNOSIS — Z3A39 39 weeks gestation of pregnancy: Secondary | ICD-10-CM

## 2018-04-11 DIAGNOSIS — Z8659 Personal history of other mental and behavioral disorders: Secondary | ICD-10-CM

## 2018-04-11 DIAGNOSIS — Z8759 Personal history of other complications of pregnancy, childbirth and the puerperium: Secondary | ICD-10-CM

## 2018-04-11 DIAGNOSIS — F411 Generalized anxiety disorder: Secondary | ICD-10-CM | POA: Diagnosis present

## 2018-04-11 DIAGNOSIS — O9952 Diseases of the respiratory system complicating childbirth: Secondary | ICD-10-CM | POA: Diagnosis present

## 2018-04-11 DIAGNOSIS — O99344 Other mental disorders complicating childbirth: Secondary | ICD-10-CM | POA: Diagnosis present

## 2018-04-11 DIAGNOSIS — J45909 Unspecified asthma, uncomplicated: Secondary | ICD-10-CM | POA: Diagnosis present

## 2018-04-11 DIAGNOSIS — O99214 Obesity complicating childbirth: Secondary | ICD-10-CM | POA: Diagnosis present

## 2018-04-11 DIAGNOSIS — O34211 Maternal care for low transverse scar from previous cesarean delivery: Principal | ICD-10-CM | POA: Diagnosis present

## 2018-04-11 DIAGNOSIS — O99210 Obesity complicating pregnancy, unspecified trimester: Secondary | ICD-10-CM

## 2018-04-11 DIAGNOSIS — O0990 Supervision of high risk pregnancy, unspecified, unspecified trimester: Secondary | ICD-10-CM

## 2018-04-11 LAB — RPR
RPR Ser Ql: NONREACTIVE
RPR Ser Ql: NONREACTIVE

## 2018-04-11 LAB — CBC
HCT: 37 % (ref 36.0–46.0)
Hemoglobin: 12.7 g/dL (ref 12.0–15.0)
MCH: 30.2 pg (ref 26.0–34.0)
MCHC: 34.3 g/dL (ref 30.0–36.0)
MCV: 87.9 fL (ref 78.0–100.0)
Platelets: 227 10*3/uL (ref 150–400)
RBC: 4.21 MIL/uL (ref 3.87–5.11)
RDW: 14.8 % (ref 11.5–15.5)
WBC: 7.8 10*3/uL (ref 4.0–10.5)

## 2018-04-11 LAB — CREATININE, SERUM
Creatinine, Ser: 0.68 mg/dL (ref 0.44–1.00)
GFR calc Af Amer: >60 mL/min (ref >60)
GFR calc non Af Amer: >60 mL/min (ref >60)

## 2018-04-11 SURGERY — Surgical Case
Anesthesia: Spinal

## 2018-04-11 SURGERY — Surgical Case
Anesthesia: Choice

## 2018-04-11 MED ORDER — HYDROMORPHONE HCL 1 MG/ML IJ SOLN: 0.2500 mg | INTRAMUSCULAR | Status: DC | PRN

## 2018-04-11 MED ORDER — ZOLPIDEM TARTRATE 5 MG PO TABS
5.0000 mg | ORAL_TABLET | Freq: Every evening | ORAL | Status: DC | PRN
Start: 2018-04-11 — End: 2018-04-13

## 2018-04-11 MED ORDER — POLYETHYLENE GLYCOL 3350 17 G PO PACK
17.0000 g | PACK | Freq: Every day | ORAL | Status: DC
  Administered 2018-04-13: 17 g via ORAL
  Filled 2018-04-11 (×3): qty 1

## 2018-04-11 MED ORDER — SENNOSIDES-DOCUSATE SODIUM 8.6-50 MG PO TABS
2.0000 | ORAL_TABLET | Freq: Every evening | ORAL | Status: DC | PRN
  Administered 2018-04-11 – 2018-04-12 (×2): 2 via ORAL

## 2018-04-11 MED ORDER — KETOROLAC TROMETHAMINE 30 MG/ML IJ SOLN
INTRAMUSCULAR | Status: AC
  Filled 2018-04-11: qty 1

## 2018-04-11 MED ORDER — CEFAZOLIN SODIUM 10 G IJ SOLR
INTRAMUSCULAR | Status: AC
  Filled 2018-04-11: qty 3000

## 2018-04-11 MED ORDER — LORATADINE 10 MG PO TABS
10.0000 mg | ORAL_TABLET | Freq: Every evening | ORAL | Status: DC
  Administered 2018-04-11 – 2018-04-12 (×2): 10 mg via ORAL
  Filled 2018-04-11 (×2): qty 1

## 2018-04-11 MED ORDER — PROMETHAZINE HCL 25 MG/ML IJ SOLN: 6.2500 mg | INTRAMUSCULAR | Status: DC | PRN

## 2018-04-11 MED ORDER — DEXAMETHASONE SODIUM PHOSPHATE 10 MG/ML IJ SOLN
INTRAMUSCULAR | Status: AC
  Filled 2018-04-11: qty 1

## 2018-04-11 MED ORDER — NALBUPHINE HCL 10 MG/ML IJ SOLN: 5.0000 mg | Freq: Once | INTRAMUSCULAR | Status: DC | PRN

## 2018-04-11 MED ORDER — SERTRALINE HCL 50 MG PO TABS
50.0000 mg | ORAL_TABLET | Freq: Every day | ORAL | Status: DC
Start: 2018-04-12 — End: 2018-04-13
  Administered 2018-04-12: 50 mg via ORAL
  Filled 2018-04-11 (×3): qty 1

## 2018-04-11 MED ORDER — MEPERIDINE HCL 25 MG/ML IJ SOLN: 6.2500 mg | INTRAMUSCULAR | Status: DC | PRN

## 2018-04-11 MED ORDER — ACETAMINOPHEN 325 MG PO TABS
650.0000 mg | ORAL_TABLET | ORAL | Status: DC | PRN
  Administered 2018-04-12 – 2018-04-13 (×2): 650 mg via ORAL
  Filled 2018-04-11 (×2): qty 2

## 2018-04-11 MED ORDER — SIMETHICONE 80 MG PO CHEW
80.0000 mg | CHEWABLE_TABLET | Freq: Three times a day (TID) | ORAL | Status: DC
  Administered 2018-04-11 – 2018-04-13 (×4): 80 mg via ORAL
  Filled 2018-04-11 (×5): qty 1

## 2018-04-11 MED ORDER — SODIUM CHLORIDE 0.9% FLUSH: 3.0000 mL | INTRAVENOUS | Status: DC | PRN

## 2018-04-11 MED ORDER — SODIUM CHLORIDE 0.9 % IR SOLN
Status: DC | PRN
  Administered 2018-04-11: 1

## 2018-04-11 MED ORDER — DIBUCAINE 1 % RE OINT: 1.0000 "application " | TOPICAL_OINTMENT | RECTAL | Status: DC | PRN

## 2018-04-11 MED ORDER — PHENYLEPHRINE 8 MG IN D5W 100 ML (0.08MG/ML) PREMIX OPTIME
INJECTION | INTRAVENOUS | Status: AC
  Filled 2018-04-11: qty 100

## 2018-04-11 MED ORDER — ONDANSETRON HCL 4 MG/2ML IJ SOLN: 4.0000 mg | Freq: Three times a day (TID) | INTRAMUSCULAR | Status: DC | PRN

## 2018-04-11 MED ORDER — EPHEDRINE SULFATE 50 MG/ML IJ SOLN
INTRAMUSCULAR | Status: DC | PRN
  Administered 2018-04-11: 5 mg via INTRAVENOUS

## 2018-04-11 MED ORDER — OXYTOCIN 10 UNIT/ML IJ SOLN
INTRAMUSCULAR | Status: AC
  Filled 2018-04-11: qty 4

## 2018-04-11 MED ORDER — DIPHENHYDRAMINE HCL 25 MG PO CAPS: 25.0000 mg | ORAL_CAPSULE | Freq: Four times a day (QID) | ORAL | Status: DC | PRN

## 2018-04-11 MED ORDER — LACTATED RINGERS IV SOLN: INTRAVENOUS | Status: DC

## 2018-04-11 MED ORDER — KETOROLAC TROMETHAMINE 30 MG/ML IJ SOLN
30.0000 mg | Freq: Once | INTRAMUSCULAR | Status: AC | PRN
  Administered 2018-04-11: 30 mg via INTRAVENOUS

## 2018-04-11 MED ORDER — ONDANSETRON HCL 4 MG/2ML IJ SOLN
INTRAMUSCULAR | Status: DC | PRN
  Administered 2018-04-11: 4 mg via INTRAVENOUS

## 2018-04-11 MED ORDER — METOCLOPRAMIDE HCL 5 MG/ML IJ SOLN
INTRAMUSCULAR | Status: DC | PRN
  Administered 2018-04-11: 10 mg via INTRAVENOUS

## 2018-04-11 MED ORDER — OXYTOCIN 10 UNIT/ML IJ SOLN
INTRAVENOUS | Status: DC | PRN
  Administered 2018-04-11: 40 [IU] via INTRAVENOUS

## 2018-04-11 MED ORDER — METOCLOPRAMIDE HCL 5 MG/ML IJ SOLN
INTRAMUSCULAR | Status: AC
  Filled 2018-04-11: qty 2

## 2018-04-11 MED ORDER — OXYTOCIN 40 UNITS IN LACTATED RINGERS INFUSION - SIMPLE MED: 2.5000 [IU]/h | INTRAVENOUS | Status: AC

## 2018-04-11 MED ORDER — COCONUT OIL OIL
1.0000 "application " | TOPICAL_OIL | Status: DC | PRN
  Filled 2018-04-11: qty 120

## 2018-04-11 MED ORDER — NALOXONE HCL 0.4 MG/ML IJ SOLN: 0.4000 mg | INTRAMUSCULAR | Status: DC | PRN

## 2018-04-11 MED ORDER — DEXTROSE 5 % IV SOLN
3.0000 g | INTRAVENOUS | Status: AC
  Administered 2018-04-11: 3 g via INTRAVENOUS
  Filled 2018-04-11: qty 3000

## 2018-04-11 MED ORDER — DIPHENHYDRAMINE HCL 25 MG PO CAPS
25.0000 mg | ORAL_CAPSULE | ORAL | Status: DC | PRN
Start: 2018-04-11 — End: 2018-04-13

## 2018-04-11 MED ORDER — NALBUPHINE HCL 10 MG/ML IJ SOLN: 5.0000 mg | INTRAMUSCULAR | Status: DC | PRN

## 2018-04-11 MED ORDER — SCOPOLAMINE 1 MG/3DAYS TD PT72
MEDICATED_PATCH | TRANSDERMAL | Status: AC
  Filled 2018-04-11: qty 1

## 2018-04-11 MED ORDER — NALOXONE HCL 4 MG/10ML IJ SOLN: 1.0000 ug/kg/h | INTRAVENOUS | Status: DC | PRN

## 2018-04-11 MED ORDER — MORPHINE SULFATE (PF) 0.5 MG/ML IJ SOLN
INTRAMUSCULAR | Status: DC | PRN
  Administered 2018-04-11: .2 mg via INTRATHECAL

## 2018-04-11 MED ORDER — SCOPOLAMINE 1 MG/3DAYS TD PT72
1.0000 | MEDICATED_PATCH | Freq: Once | TRANSDERMAL | Status: DC
  Administered 2018-04-11: 1.5 mg via TRANSDERMAL

## 2018-04-11 MED ORDER — OXYCODONE HCL 5 MG PO TABS
5.0000 mg | ORAL_TABLET | ORAL | Status: DC | PRN
  Administered 2018-04-12 – 2018-04-13 (×5): 5 mg via ORAL
  Filled 2018-04-11 (×5): qty 1

## 2018-04-11 MED ORDER — MORPHINE SULFATE (PF) 0.5 MG/ML IJ SOLN
INTRAMUSCULAR | Status: AC
  Filled 2018-04-11: qty 10

## 2018-04-11 MED ORDER — DIPHENHYDRAMINE HCL 50 MG/ML IJ SOLN: 12.5000 mg | INTRAMUSCULAR | Status: DC | PRN

## 2018-04-11 MED ORDER — PHENYLEPHRINE 8 MG IN D5W 100 ML (0.08MG/ML) PREMIX OPTIME
INJECTION | INTRAVENOUS | Status: DC | PRN
  Administered 2018-04-11: 60 ug/min via INTRAVENOUS

## 2018-04-11 MED ORDER — BUPIVACAINE IN DEXTROSE 0.75-8.25 % IT SOLN
INTRATHECAL | Status: DC | PRN
  Administered 2018-04-11: 1.8 mL via INTRATHECAL

## 2018-04-11 MED ORDER — MENTHOL 3 MG MT LOZG: 1.0000 | LOZENGE | OROMUCOSAL | Status: DC | PRN

## 2018-04-11 MED ORDER — PHENYLEPHRINE 40 MCG/ML (10ML) SYRINGE FOR IV PUSH (FOR BLOOD PRESSURE SUPPORT)
PREFILLED_SYRINGE | INTRAVENOUS | Status: AC
  Filled 2018-04-11: qty 10

## 2018-04-11 MED ORDER — ONDANSETRON HCL 4 MG/2ML IJ SOLN
INTRAMUSCULAR | Status: AC
  Filled 2018-04-11: qty 2

## 2018-04-11 MED ORDER — TETANUS-DIPHTH-ACELL PERTUSSIS 5-2.5-18.5 LF-MCG/0.5 IM SUSP: 0.5000 mL | Freq: Once | INTRAMUSCULAR | Status: DC

## 2018-04-11 MED ORDER — EPHEDRINE 5 MG/ML INJ
INTRAVENOUS | Status: AC
  Filled 2018-04-11: qty 10

## 2018-04-11 MED ORDER — IBUPROFEN 600 MG PO TABS
600.0000 mg | ORAL_TABLET | Freq: Four times a day (QID) | ORAL | Status: DC | PRN
  Administered 2018-04-11 – 2018-04-13 (×7): 600 mg via ORAL
  Filled 2018-04-11 (×6): qty 1

## 2018-04-11 MED ORDER — SOD CITRATE-CITRIC ACID 500-334 MG/5ML PO SOLN
30.0000 mL | ORAL | Status: AC
  Administered 2018-04-11: 30 mL via ORAL
  Filled 2018-04-11: qty 15

## 2018-04-11 MED ORDER — ALBUTEROL SULFATE (2.5 MG/3ML) 0.083% IN NEBU: 3.0000 mL | INHALATION_SOLUTION | RESPIRATORY_TRACT | Status: DC | PRN

## 2018-04-11 MED ORDER — PRENATAL MULTIVITAMIN CH
1.0000 | ORAL_TABLET | Freq: Every day | ORAL | Status: DC
  Administered 2018-04-12 – 2018-04-13 (×2): 1 via ORAL
  Filled 2018-04-11 (×2): qty 1

## 2018-04-11 MED ORDER — PHENYLEPHRINE HCL 10 MG/ML IJ SOLN
INTRAMUSCULAR | Status: DC | PRN
  Administered 2018-04-11 (×5): 80 ug via INTRAVENOUS

## 2018-04-11 MED ORDER — OXYCODONE HCL 5 MG PO TABS: 10.0000 mg | ORAL_TABLET | ORAL | Status: DC | PRN

## 2018-04-11 MED ORDER — ENOXAPARIN SODIUM 80 MG/0.8ML ~~LOC~~ SOLN
0.5000 mg/kg | SUBCUTANEOUS | Status: DC
  Administered 2018-04-12 – 2018-04-13 (×2): 65 mg via SUBCUTANEOUS
  Filled 2018-04-11 (×3): qty 0.8

## 2018-04-11 MED ORDER — LACTATED RINGERS IV SOLN
INTRAVENOUS | Status: DC | PRN
  Administered 2018-04-11 (×3): via INTRAVENOUS

## 2018-04-11 MED ORDER — WITCH HAZEL-GLYCERIN EX PADS: 1.0000 "application " | MEDICATED_PAD | CUTANEOUS | Status: DC | PRN

## 2018-04-11 SURGICAL SUPPLY — 36 items
BENZOIN TINCTURE PRP APPL 2/3 (GAUZE/BANDAGES/DRESSINGS) ×3 IMPLANT
CANISTER PREVENA PLUS 150 (CANNISTER) ×3 IMPLANT
CANISTER SUCT 3000ML PPV (MISCELLANEOUS) ×3 IMPLANT
CHLORAPREP W/TINT 26ML (MISCELLANEOUS) ×3 IMPLANT
CLOSURE STERI STRIP 1/2 X4 (GAUZE/BANDAGES/DRESSINGS) ×3 IMPLANT
DRSG OPSITE POSTOP 4X10 (GAUZE/BANDAGES/DRESSINGS) ×3 IMPLANT
ELECT REM PT RETURN 9FT ADLT (ELECTROSURGICAL) ×3
ELECTRODE REM PT RTRN 9FT ADLT (ELECTROSURGICAL) ×1 IMPLANT
EXTRACTOR VACUUM KIWI (MISCELLANEOUS) ×3 IMPLANT
GLOVE BIOGEL PI IND STRL 7.0 (GLOVE) ×2 IMPLANT
GLOVE BIOGEL PI IND STRL 7.5 (GLOVE) ×1 IMPLANT
GLOVE BIOGEL PI INDICATOR 7.0 (GLOVE) ×4
GLOVE BIOGEL PI INDICATOR 7.5 (GLOVE) ×2
GLOVE SKINSENSE NS SZ7.0 (GLOVE) ×2
GLOVE SKINSENSE STRL SZ7.0 (GLOVE) ×1 IMPLANT
GOWN STRL REUS W/ TWL LRG LVL3 (GOWN DISPOSABLE) ×2 IMPLANT
GOWN STRL REUS W/ TWL XL LVL3 (GOWN DISPOSABLE) ×1 IMPLANT
GOWN STRL REUS W/TWL LRG LVL3 (GOWN DISPOSABLE) ×4
GOWN STRL REUS W/TWL XL LVL3 (GOWN DISPOSABLE) ×2
NS IRRIG 1000ML POUR BTL (IV SOLUTION) ×3 IMPLANT
PACK C SECTION WH (CUSTOM PROCEDURE TRAY) ×3 IMPLANT
PAD ABD 7.5X8 STRL (GAUZE/BANDAGES/DRESSINGS) ×3 IMPLANT
PAD OB MATERNITY 4.3X12.25 (PERSONAL CARE ITEMS) ×3 IMPLANT
PAD PREP 24X48 CUFFED NSTRL (MISCELLANEOUS) ×3 IMPLANT
PENCIL SMOKE EVAC W/HOLSTER (ELECTROSURGICAL) ×3 IMPLANT
STRIP CLOSURE SKIN 1/2X4 (GAUZE/BANDAGES/DRESSINGS) ×2 IMPLANT
SUT CHROMIC 1 CTX 36 (SUTURE) ×3 IMPLANT
SUT MNCRL 0 VIOLET CTX 36 (SUTURE) ×2 IMPLANT
SUT MON AB 4-0 PS1 27 (SUTURE) ×3 IMPLANT
SUT MONOCRYL 0 CTX 36 (SUTURE) ×4
SUT PLAIN 2 0 XLH (SUTURE) ×6 IMPLANT
SUT VIC AB 0 CT1 36 (SUTURE) ×6 IMPLANT
SUT VIC AB 3-0 CT1 27 (SUTURE) ×2
SUT VIC AB 3-0 CT1 TAPERPNT 27 (SUTURE) ×1 IMPLANT
SUT VIC AB 4-0 PS2 27 (SUTURE) ×3 IMPLANT
TOWEL OR 17X24 6PK STRL BLUE (TOWEL DISPOSABLE) ×6 IMPLANT

## 2018-04-11 NOTE — Anesthesia Postprocedure Evaluation (Signed)
Anesthesia Post Note  Patient: Mary Campos  Procedure(s) Performed: REPEAT CESAREAN SECTION (N/A )     Patient location during evaluation: Mother Baby Anesthesia Type: Spinal Level of consciousness: awake and alert Pain management: pain level controlled Vital Signs Assessment: post-procedure vital signs reviewed and stable Respiratory status: spontaneous breathing, nonlabored ventilation and respiratory function stable Cardiovascular status: stable Postop Assessment: no headache, no backache, no apparent nausea or vomiting, patient able to bend at knees, able to ambulate, spinal receding and adequate PO intake Anesthetic complications: no    Last Vitals:  Vitals:   04/11/18 1400 04/11/18 1608  BP: (!) 104/47 (!) 107/41  Pulse: (!) 57 (!) 56  Resp:    Temp: 37.1 C 36.7 C  SpO2:  98%    Last Pain:  Vitals:   04/11/18 1609  TempSrc:   PainSc: 0-No pain   Pain Goal:                 [name],Mary Campos

## 2018-04-11 NOTE — Anesthesia Procedure Notes (Signed)
Spinal  Patient location during procedure: OR Staffing Anesthesiologist: Nolon Nations, MD Performed: anesthesiologist  Preanesthetic Checklist Completed: patient identified, site marked, surgical consent, pre-op evaluation, timeout performed, IV checked, risks and benefits discussed and monitors and equipment checked Spinal Block Patient position: sitting Prep: site prepped and draped and DuraPrep Patient monitoring: heart rate, continuous pulse ox and blood pressure Approach: midline Location: L3-4 Injection technique: single-shot Needle Needle type: Sprotte  Needle gauge: 24 G Needle length: 9 cm Assessment Sensory level: T6 Additional Notes Expiration date of kit checked and confirmed. Patient tolerated procedure well, without complications.

## 2018-04-11 NOTE — Transfer of Care (Signed)
Immediate Anesthesia Transfer of Care Note  Patient: Mary GranaJamie Campos  Procedure(s) Performed: REPEAT CESAREAN SECTION (N/A )  Patient Location: PACU  Anesthesia Type:Spinal  Level of Consciousness: awake, alert  and oriented  Airway & Oxygen Therapy: Patient Spontanous Breathing  Post-op Assessment: Report given to RN and Post -op Vital signs reviewed and stable  Post vital signs: Reviewed and stable  Last Vitals:  Vitals Value Taken Time  BP 86/41 04/11/2018  9:10 AM  Temp    Pulse 60 04/11/2018  9:12 AM  Resp 15 04/11/2018  9:13 AM  SpO2 97 % 04/11/2018  9:12 AM  Vitals shown include unvalidated device data.  Last Pain:  Vitals:   04/11/18 0611  TempSrc: Oral         Complications: No apparent anesthesia complications

## 2018-04-11 NOTE — Anesthesia Postprocedure Evaluation (Signed)
Anesthesia Post Note  Patient: Mary GranaJamie Campos  Procedure(s) Performed: REPEAT CESAREAN SECTION (N/A )     Patient location during evaluation: PACU Anesthesia Type: Spinal Level of consciousness: awake and alert Pain management: pain level controlled Vital Signs Assessment: post-procedure vital signs reviewed and stable Respiratory status: spontaneous breathing and respiratory function stable Cardiovascular status: blood pressure returned to baseline and stable Postop Assessment: spinal receding Anesthetic complications: no Comments: RN stated pt had afib in PACU. Upon evaluation it seemed to be just muscle artifact that the monitor was detecting and interpreting as rapid atrial contraction. The patient was never tachycardic, and there were p-waves evident even in the artifact.     Last Vitals:  Vitals:   04/11/18 1106 04/11/18 1254  BP: (!) 115/55 (!) 106/55  Pulse: (!) 48 (!) 53  Resp: 18   Temp: 36.7 C 37.2 C  SpO2: 97% 97%    Last Pain:  Vitals:   04/11/18 1254  TempSrc: Oral  PainSc:    Pain Goal:                 Lewie LoronJohn Ori Kreiter

## 2018-04-11 NOTE — H&P (Signed)
Obstetrics Admission History & Physical  04/11/2018 - 7:15 AM Primary OBGYN: [facility] (Transfer from [facility] due to BMI)  Chief Complaint: scheduled repeat c-section  History of Present Illness  32 y.o. [id number] @ [redacted]w[redacted]d, with the above CC. Pregnancy complicated by: h/o c-section x 3, BMI 50s, anxiety, asthma (no meds), h/o gHTN.  Ms. Mary Campos states that she has no s/s of decreased FM or labor.   Review of Systems: as noted in the History of Present Illness.  Patient Active Problem List   Diagnosis Date Noted  . History of cesarean delivery 04/11/2018  . BMI 50.0-59.9, adult (HCC) 02/21/2018  . Supervision of high risk pregnancy, antepartum 08/29/2017  . Maternal obesity, antepartum 08/29/2017  . History of gestational hypertension 08/26/2017  . History of 3 cesarean sections 08/26/2017  . Anxiety state 08/11/2010  . Asthma 08/11/2010    PMHx:  Past Medical History:  Diagnosis Date  . Asthma    albuteral inhaler PRN  . Depression    hx mild ppd   PSHx:  Past Surgical History:  Procedure Laterality Date  . CESAREAN SECTION     X 2  . CESAREAN SECTION WITH BILATERAL TUBAL LIGATION Bilateral 07/03/2016   Procedure: CESAREAN SECTION/ Female 8lbs. 3oz;  Surgeon: Nadara Mustard, MD;  Location: [facility] ORS;  Service: Obstetrics;  Laterality: Bilateral;  . TONSILLECTOMY     Medications:  Medications Prior to Admission  Medication Sig Dispense Refill Last Dose  . albuterol (ACCUNEB) 0.63 MG/3ML nebulizer solution Take 3 mLs (0.63 mg total) by nebulization every 6 (six) hours as needed for wheezing. 75 mL 0 Taking  . albuterol (VENTOLIN HFA) 108 (90 Base) MCG/ACT inhaler Inhale 2 puffs into the lungs every 4 (four) hours as needed for wheezing or shortness of breath. 1 Inhaler 3 Taking  . Butalbital-APAP-Caffeine 50-325-40 MG capsule Take 1-2 capsules by mouth every 6 (six) hours as needed for headache. (Patient not taking: Reported on 04/09/2018) 20  capsule 1 Not Taking  . folic acid (FOLVITE) 1 MG tablet Take 1 tablet (1 mg total) daily by mouth. 30 tablet 10 Taking  . loratadine (CLARITIN) 10 MG tablet Take 10 mg by mouth every evening.    Taking  . ondansetron (ZOFRAN-ODT) 4 MG disintegrating tablet Take 4 mg by mouth every 8 (eight) hours as needed for nausea or vomiting.   Taking  . Prenatal Vit-Fe Fumarate-FA (PRENATAL PO) Take 1 tablet by mouth every evening.    Taking  . prochlorperazine (COMPAZINE) 10 MG tablet Take 1 tablet (10 mg total) by mouth every 6 (six) hours as needed for nausea or vomiting (headache). (Patient not taking: Reported on 04/09/2018) 30 tablet 3 Not Taking  . promethazine (PHENERGAN) 25 MG tablet Take 1/2 to 1 tablet po every 6 hours as needed for nausea (Patient not taking: Reported on 04/09/2018) 30 tablet 2 Not Taking  . sertraline (ZOLOFT) 50 MG tablet Take 1 tablet (50 mg total) by mouth daily. (Patient taking differently: Take 50 mg by mouth every evening. ) 60 tablet 2 Taking     Allergies: is allergic to azithromycin; eletriptan hydrobromide; pseudoephedrine; and tape. OBHx:  OB History  Gravida Para Term Preterm AB Living  4 3 3  0 0 3  SAB TAB Ectopic Multiple Live Births  0 0 0 0 3    # Outcome Date GA Lbr Len/2nd Weight Sex Delivery Anes PTL Lv  4 Current  3 Term 07/03/16 [redacted]w[redacted]d  8 lb 2.5 oz (3.7 kg) M CS-LTranv Spinal  LIV  2 Term      CS-LTranv     1 Term      CS-LTranv              FHx:  Family History  Problem Relation Age of Onset  . Hypertension Mother    Soc Hx:  Social History   Socioeconomic History  . Marital status: Married    Spouse name: Not on file  . Number of children: Not on file  . Years of education: Not on file  . Highest education level: Not on file  Occupational History  . Not on file  Social Needs  . Financial resource strain: Not on file  . Food insecurity:    Worry: Not on file    Inability: Not on file  . Transportation needs:    Medical: Not  on file    Non-medical: Not on file  Tobacco Use  . Smoking status: Never Smoker  . Smokeless tobacco: Never Used  Substance and Sexual Activity  . Alcohol use: No  . Drug use: No  . Sexual activity: Yes    Birth control/protection: None  Lifestyle  . Physical activity:    Days per week: Not on file    Minutes per session: Not on file  . Stress: Not on file  Relationships  . Social connections:    Talks on phone: Not on file    Gets together: Not on file    Attends religious service: Not on file    Active member of club or organization: Not on file    Attends meetings of clubs or organizations: Not on file    Relationship status: Not on file  . Intimate partner violence:    Fear of current or ex partner: Not on file    Emotionally abused: Not on file    Physically abused: Not on file    Forced sexual activity: Not on file  Other Topics Concern  . Not on file  Social History Narrative  . Not on file    Objective    Current Vital Signs 24h Vital Sign Ranges  T 98.7 F (37.1 C) Temp  Avg: 98.7 F (37.1 C)  Min: 98.7 F (37.1 C)  Max: 98.7 F (37.1 C)  BP 104/75 BP  Min: 104/75  Max: 104/75  HR 85 Pulse  Avg: 85  Min: 85  Max: 85  RR 18 Resp  Avg: 18  Min: 18  Max: 18  SaO2     No data recorded       24 Hour I/O Current Shift I/O  Time Ins Outs No intake/output data recorded. No intake/output data recorded.   FHTs: 140s   General: Well nourished, well developed female in no acute distress.  Skin:  Warm and dry.  Cardiovascular: S1, S2 normal, no murmur, rub or gallop, regular rate and rhythm Respiratory:  Clear to auscultation bilateral. Normal respiratory effort Abdomen: obese, nttp, gravid Neuro/Psych:  Normal mood and affect.   Labs  Conflict (See Lab Report): A POS/A POS Performed at [facility], [address], [place], [place] [address]  Recent Labs  Lab 04/10/18 1108 04/11/18 0610  WBC 6.5 7.8  HGB 12.2 12.7  HCT 35.8* 37.0  PLT 218 227    Radiology Anterior placenta on prior u/s  Perinatal info  Conflict (See Lab Report): A POS/A POS Performed at [facility], [address] [place], [place] [address] / Ishmael Holterubella  Immune / Varicella Unknown/RPR pending/HIV negative/HepB Surf Ag negative/TDaP:recommend pp /pap unknown/  Assessment & Plan   32 y.o. [id number] @ 3750w0d doing well *Pregnancy: routine care. Doesn't desire BTL *h/o prior c-section: last op note reviewed and no e/o adhesions. Pt consented and can proceed to OR when ready *BMI 50s: pt amenable to PP lovenox while in house for VTE prevention *Anxiety: continue sertraline PP *Analgesia: given last op note, I'd be okay with spinal or epidural  Cornelia Copaharlie Arnisha Laffoon, Jr. MD Attending [facility](Faculty Practice)

## 2018-04-11 NOTE — Addendum Note (Signed)
Addendum  created 04/11/18 1636 by Elgie CongoMalinova, Venise Ellingwood H, CRNA   Sign clinical note

## 2018-04-11 NOTE — Lactation Note (Signed)
This note was copied from a baby's chart. Lactation Consultation Note  Patient Name: Mary Campos ZOXWR'UToday's Date: 04/11/2018 Reason for consult: Initial assessment;Term  P4 mother whose infant is now 414 hours old.  Mother has 3 other children and breastfed all for approximately 7 months each.  Mother doing STS with baby as I entered.  She has no questions/concerns at this times and feels like the feedings are going well.  She states that baby has latched well since delivery.  Her breasts are soft and non tender and nipples are erect with no breakdown.  Encouraged feeding 8-12 times/24 hours or more if baby shows feeding cues.  Continue STS, breast massage and hand expression.  Reminded mother she can do hand expression after feedings to help increase milk supply.  Mother will call for assistance as needed.  Father and son will be spending the night.     Maternal Data Formula Feeding for Exclusion: No Has patient been taught Hand Expression?: Yes Does the patient have breastfeeding experience prior to this delivery?: Yes  Feeding Feeding Type: Breast Fed  LATCH Score                   Interventions    Lactation Tools Discussed/Used     Consult Status Consult Status: Follow-up Date: 04/12/18 Follow-up type: In-patient    Cyana Shook R Shah Insley 04/11/2018, 10:21 PM

## 2018-04-11 NOTE — Op Note (Signed)
Operative Note   SURGERY DATE: 04/11/2018  PRE-OP DIAGNOSIS:  *Pregnancy at 39/0 weeks *History of cesarean x 3 *BMI 54  POST-OP DIAGNOSIS: Same. delivered   PROCEDURE: repeat low transverse cesarean section via pfannenstiel skin incision with double layer uterine closure with application of prevena wound closure device  SURGEON: Surgeon(s) and Role:    [place] Bing* Jejuan Scala, MD - Primary  ASSISTANT: RNFA  ANESTHESIA: spinal  ESTIMATED BLOOD LOSS: 833mL  DRAINS: 50mL UOP via indwelling foley  TOTAL IV FLUIDS: 2600mL crystalloid  VTE PROPHYLAXIS: SCDs to bilateral lower extremities  ANTIBIOTICS: Three grams of Cefazolin were given., within 1 hour of skin incision  SPECIMENS: none  COMPLICATIONS: none  INDICATIONS: none  FINDINGS: Fortunately, no intra-abdominal adhesions were noted. Grossly normal uterus, tubes and ovaries. clear amniotic fluid, cephalic female infant, weight 3310gm, APGARs 8/9, intact placenta.  PROCEDURE IN DETAIL: The patient was taken to the operating room where anesthesia was administered and normal fetal heart tones were confirmed. She was then prepped and draped in the normal fashion in the dorsal supine position with a leftward tilt.  After a time out was performed, a pfannensteil skin incision was made with the scalpel and carried through to the underlying layer of fascia. The fascia was then incised at the midline and this incision was extended laterally with the mayo scissors. Attention was turned to the superior aspect of the fascial incision which was grasped with the kocher clamps x 2, tented up and the rectus muscles were dissected off with the scalpel. In a similar fashion the inferior aspect of the fascial incision was grasped with the kocher clamps, tented up and the rectus muscles dissected off with the mayo scissors. The rectus muscles were then separated in the midline and the peritoneum was entered bluntly. The bladder blade was inserted and the  vesicouterine peritoneum was identified, tented up and entered with the metzenbaum scissors. This incision was extended laterally and the bladder flap was created digitally. The bladder blade was reinserted.  A low transverse hysterotomy was made with the scalpel until the endometrial cavity was breached and the amniotic sac ruptured with the Allis clamp, yielding clear amniotic fluid. This incision was extended bluntly and the infant's head, shoulders and body were delivered atraumatically.The cord was clamped x 2 and cut, and the infant was handed to the awaiting pediatricians, after delayed cord clamping was done.  The placenta was then gradually expressed from the uterus and then the uterus was exteriorized and cleared of all clots and debris. The hysterotomy was repaired with a running suture of 1-0 monocryl. A second imbricating layer of 1-0 monocryl suture was then placed. Several figure-of-eight sutures of #1 chromic were added to achieve excellent hemostasis at the hysterotomy site  The uterus and adnexa were then returned to the abdomen, and the hysterotomy and all operative sites were reinspected and excellent hemostasis was noted after irrigation and suction of the abdomen with warm saline.  The peritoneum was closed with a running stitch of 3-0 Vicryl. The fascia was reapproximated with 0 Vicryl in a simple running fashion bilaterally. The subcutaneous layer was then reapproximated with interrupted sutures of 2-0 plain gut in two layers, and the skin was then closed with 4-0 vicryl, in a subcuticular fashion. Steri strips and benzoin were applied and then the prevena device was applied  The patient  tolerated the procedure well. Sponge, lap, needle, and instrument counts were correct x 2. The patient was transferred to the recovery room awake,  alert and breathing independently in stable condition.  Cornelia Copa MD Attending Center for [facility])

## 2018-04-12 LAB — CBC
HEMATOCRIT: 29.8 % — AB (ref 36.0–46.0)
Hemoglobin: 10.2 g/dL — ABNORMAL LOW (ref 12.0–15.0)
MCH: 30.6 pg (ref 26.0–34.0)
MCHC: 34.2 g/dL (ref 30.0–36.0)
MCV: 89.5 fL (ref 78.0–100.0)
Platelets: 164 10*3/uL (ref 150–400)
RBC: 3.33 MIL/uL — ABNORMAL LOW (ref 3.87–5.11)
RDW: 15.2 % (ref 11.5–15.5)
WBC: 9.9 10*3/uL (ref 4.0–10.5)

## 2018-04-12 NOTE — Progress Notes (Addendum)
Subjective: Postpartum Day 1: Cesarean Delivery Patient reports incisional pain, tolerating PO, + flatus and no problems voiding.    Objective: Vital signs in last 24 hours: Temp:  [97.7 F (36.5 C)-99.7 F (37.6 C)] 98.1 F (36.7 C) (06/22 0558) Pulse Rate:  [48-85] 50 (06/22 0558) Resp:  [13-21] 16 (06/22 0558) BP: (88-130)/(41-78) 96/53 (06/22 0558) SpO2:  [97 %-100 %] 98 % (06/21 1608) Weight:  [293 lb 14 oz (133.3 kg)] 293 lb 14 oz (133.3 kg) (06/21 16100611)  Physical Exam:  General: alert, cooperative and no distress Lochia: appropriate Uterine Fundus: firm Incision: healing well, no significant drainage DVT Evaluation: No evidence of DVT seen on physical exam.  Recent Labs    04/10/18 1108 04/11/18 0610  HGB 12.2 12.7  HCT 35.8* 37.0    Assessment/Plan: Status post Cesarean section. Doing well postoperatively.  Continue current care. Undecided of contraception  Garnette Gunneraron B Thompson 04/12/2018, 6:02 AM   I confirm that I have verified the information documented in the resident's note and that I have also personally reperformed the physical exam and all medical decision making activities.    Luna KitchensKathryn Kahlil Cowans CNM

## 2018-04-12 NOTE — Progress Notes (Signed)
Mother of baby was referred for history of depression and anxiety. Referral screened out by CSW because per prenatal record, patient is actively taking 50mg  of Zoloft daily to address her symptoms.    Please contact CSW if mother of baby requests, if needs arise, or if mother of baby scores greater than a nine or answers yes to question ten on [place] Postpartum Depression Screen.   Edwin Dadaarol Naeema Patlan, MSW, LCSW-A Clinical Social Worker [facility] [phone]

## 2018-04-12 NOTE — Progress Notes (Signed)
ANTICOAGULATION CONSULT NOTE - Initial Consult  Pharmacy Consult for enoxaparin Indication: VTE prophylaxis s/p C-section  Allergies  Allergen Reactions  . Azithromycin Shortness Of Breath    Stomach spasms   . Eletriptan Hydrobromide Shortness Of Breath    Stomach spasms  . Pseudoephedrine Palpitations    Loses focus   . Tape Rash    Surgical tape    Patient Measurements: Height: 5\' 2"  (157.5 cm) Weight: 293 lb 14 oz (133.3 kg) IBW/kg (Calculated) : 50.1  Vital Signs: Temp: 98.1 F (36.7 C) (06/22 0558) Temp Source: Oral (06/22 0558) BP: 96/53 (06/22 0558) Pulse Rate: 50 (06/22 0558)  Labs: Recent Labs    04/10/18 1108 04/11/18 0610 04/11/18 2057 04/12/18 0710  HGB 12.2 12.7  --  10.2*  HCT 35.8* 37.0  --  29.8*  PLT 218 227  --  164  CREATININE  --   --  0.68  --     Estimated Creatinine Clearance: 135.4 mL/min (by C-G formula based on SCr of 0.68 mg/dL).   Medical History: Past Medical History:  Diagnosis Date  . Asthma    albuteral inhaler PRN  . Depression    hx mild ppd    Medications:  Ibuprofen 600mg  PO q6h  Assessment: JS is a 32 YO  Female s/p C-section with spinal anesthesia only (AET 6/21 @ 09:15). She is being started on enoxaparin for VTE prophylaxis. H/H 10.2/29.8 and stable. BMI >30 kg/m2.   Plan:  Will started enoxaparin 0.5 mg/kg subcutaneous daily due to BMI at least 12-18 hours after C-section.  Benetta SparVictoria Madalina Rosman 04/12/2018,8:05 AM

## 2018-04-13 MED ORDER — OXYCODONE HCL 5 MG PO TABS: 5.0000 mg | ORAL_TABLET | ORAL | 0 refills | Status: DC | PRN

## 2018-04-13 NOTE — Discharge Summary (Signed)
OB Discharge Summary     Patient Name: Mary Campos DOB: 1985-12-05 MRN: [id number]  Date of admission: 04/11/2018 Delivering MD: [place] BingPICKENS, CHARLIE   Date of discharge: 04/13/2018  Admitting diagnosis: RCS Intrauterine pregnancy: 1350w0d     Secondary diagnosis:  Active Problems:   History of cesarean delivery   History of postpartum depression  Additional problems: Repeat C/S, anxiety      Discharge diagnosis: Term Pregnancy Delivered                                                                                                Post partum procedures:none  Augmentation: none  Complications: None  Hospital course:  Sceduled C/S   32 y.o. yo [id number] at 3850w0d was admitted to the hospital 04/11/2018 for scheduled cesarean section with the following indication:Elective Repeat.  Membrane Rupture Time/Date: 8:03 AM ,04/11/2018   Patient delivered a Viable infant.04/11/2018  Details of operation can be found in separate operative note.  Pateint had an uncomplicated postpartum course.  She is ambulating, tolerating a regular diet, passing flatus, and urinating well. Patient is discharged home in stable condition on  04/13/18         Physical exam  Vitals:   04/12/18 0558 04/12/18 1538 04/12/18 2100 04/13/18 0547  BP: (!) 96/53 (!) 116/49 (!) 111/51 (!) 104/57  Pulse: (!) 50 60 60 (!) 51  Resp: 16 18 18 18   Temp: 98.1 F (36.7 C) 98.5 F (36.9 C) 98.8 F (37.1 C) 98.2 F (36.8 C)  TempSrc: Oral Oral Oral Oral  SpO2:      Weight:      Height:       General: alert, cooperative and no distress Lochia: appropriate Uterine Fundus: firm Incision: Prevena device in place DVT Evaluation: No evidence of DVT seen on physical exam. No cords or calf tenderness. No significant calf/ankle edema. Labs: Lab Results  Component Value Date   WBC 9.9 04/12/2018   HGB 10.2 (L) 04/12/2018   HCT 29.8 (L) 04/12/2018   MCV 89.5 04/12/2018   PLT 164 04/12/2018   CMP Latest Ref Rng & Units  04/11/2018  Glucose 65 - 99 mg/dL -  BUN 6 - 20 mg/dL -  Creatinine 1.470.44 - 8.291.00 mg/dL 5.620.68  Sodium 130135 - 865145 mmol/L -  Potassium 3.5 - 5.1 mmol/L -  Chloride 101 - 111 mmol/L -  CO2 22 - 32 mmol/L -  Calcium 8.9 - 10.3 mg/dL -  Total Protein 6.0 - 8.5 g/dL -  Total Bilirubin 0.0 - 1.2 mg/dL -  Alkaline Phos 39 - 784117 IU/L -  AST 0 - 40 IU/L -  ALT 0 - 32 IU/L -    Discharge instruction: per After Visit Summary and "Baby and Me Booklet".  After visit meds:  Allergies as of 04/13/2018      Reactions   Azithromycin Shortness Of Breath   Stomach spasms    Eletriptan Hydrobromide Shortness Of Breath   Stomach spasms   Pseudoephedrine Palpitations   Loses focus    Tape Rash   Surgical tape  Medication List    TAKE these medications   albuterol 0.63 MG/3ML nebulizer solution Commonly known as:  ACCUNEB Take 3 mLs (0.63 mg total) by nebulization every 6 (six) hours as needed for wheezing.   albuterol 108 (90 Base) MCG/ACT inhaler Commonly known as:  VENTOLIN HFA Inhale 2 puffs into the lungs every 4 (four) hours as needed for wheezing or shortness of breath.   Butalbital-APAP-Caffeine 50-325-40 MG capsule Take 1-2 capsules by mouth every 6 (six) hours as needed for headache.   folic acid 1 MG tablet Commonly known as:  FOLVITE Take 1 tablet (1 mg total) daily by mouth.   loratadine 10 MG tablet Commonly known as:  CLARITIN Take 10 mg by mouth every evening.   ondansetron 4 MG disintegrating tablet Commonly known as:  ZOFRAN-ODT Take 4 mg by mouth every 8 (eight) hours as needed for nausea or vomiting.   oxyCODONE 5 MG immediate release tablet Commonly known as:  Oxy IR/ROXICODONE Take 1 tablet (5 mg total) by mouth every 4 (four) hours as needed (pain scale 4-7).   PRENATAL PO Take 1 tablet by mouth every evening.   prochlorperazine 10 MG tablet Commonly known as:  COMPAZINE Take 1 tablet (10 mg total) by mouth every 6 (six) hours as needed for nausea or  vomiting (headache).   promethazine 25 MG tablet Commonly known as:  PHENERGAN Take 1/2 to 1 tablet po every 6 hours as needed for nausea   sertraline 50 MG tablet Commonly known as:  ZOLOFT Take 1 tablet (50 mg total) by mouth daily. What changed:  when to take this       Diet: routine diet  Activity: Advance as tolerated. Pelvic rest for 6 weeks.   Outpatient follow up:2 weeks for incision check and BH appointment  Follow up Appt: Future Appointments  Date Time Provider Department Center  04/17/2018 11:30 AM [facility] NURSE [facility]  05/09/2018 11:15 AM [place] Bing, MD [facility]   Follow up Visit:No follow-ups on file.  Postpartum contraception: Nexplanon  Newborn Data: Live born female  Birth Weight: 7 lb 4.8 oz (3310 g) APGAR: 8, 9  Newborn Delivery   Birth date/time:  04/11/2018 08:03:00 Delivery type:  C-Section, Low Transverse Trial of labor:  No C-section categorization:  Repeat     Baby Feeding: Breast Disposition:home with mother   04/13/2018 Sharyon Cable, CNM

## 2018-04-13 NOTE — Lactation Note (Signed)
This note was copied from a baby's chart. Lactation Consultation Note  Patient Name: Mary Delsa GranaJamie Barella UJWJX'BToday's Date: 04/13/2018 Reason for consult: Follow-up assessment;Term;Infant weight loss  54 hours old FT female who is now being mostly BF but also formula fed my his mother, she's a P4. Mom and baby are going home today, baby is at 6% weight loss but mom already started supplementing with Enfamil 20 calorie formula las night. Per mom BF is going better now, she's still wearing her comfort gels, the soreness has improved. Reviewed treatment for sore nipples, she also has coconut oil. Discussed engorgement prevention and treatment and discharge instructions on when to call baby's pediatrician. Mom has two hand pumps at home and has LC OP phone number in case questions or concerns arise.  Maternal Data    Feeding     Interventions Interventions: Breast feeding basics reviewed  Lactation Tools Discussed/Used     Consult Status Consult Status: Complete Date: 04/13/18 Follow-up type: Call as needed    Deen Deguia Venetia ConstableS Tate Jerkins 04/13/2018, 2:14 PM

## 2018-04-13 NOTE — Progress Notes (Signed)
Consulted Dr Emelda FearFerguson re Prevena wound vac follow up. Patient to follow up in 1 week for wound vac removal and incision check (instead of 2 weeks).

## 2018-04-15 ENCOUNTER — Encounter: Payer: Self-pay | Admitting: Family Medicine

## 2018-04-15 ENCOUNTER — Ambulatory Visit (INDEPENDENT_AMBULATORY_CARE_PROVIDER_SITE_OTHER): Payer: 59 | Admitting: Family Medicine

## 2018-04-15 VITALS — BP 103/67 | HR 56 | Ht 62.0 in | Wt 290.4 lb

## 2018-04-15 DIAGNOSIS — Z09 Encounter for follow-up examination after completed treatment for conditions other than malignant neoplasm: Secondary | ICD-10-CM

## 2018-04-15 NOTE — Progress Notes (Signed)
   Subjective:    Patient ID: Mary GranaJamie Campos is a 32 y.o. female presenting with Wound Check  on 04/15/2018  HPI: S/p 4th RCS on 6/21. Has Prevena wound vac. Reports itching and burning. She would like to have it removed. Denies fever/chills.  Review of Systems  Constitutional: Negative for chills and fever.  Respiratory: Negative for shortness of breath.   Cardiovascular: Negative for chest pain.  Gastrointestinal: Negative for abdominal pain, nausea and vomiting.  Genitourinary: Negative for dysuria.  Skin: Negative for rash.      Objective:    BP 103/67   Pulse (!) 56   Ht 5\' 2"  (1.575 m)   Wt 290 lb 6.4 oz (131.7 kg)   LMP 07/12/2017   BMI 53.11 kg/m  Physical Exam  Constitutional: She is oriented to person, place, and time. She appears well-developed and well-nourished. No distress.  HENT:  Head: Normocephalic and atraumatic.  Eyes: No scleral icterus.  Neck: Neck supple.  Cardiovascular: Normal rate.  Pulmonary/Chest: Effort normal.  Abdominal: Soft.  Neurological: She is alert and oriented to person, place, and time.  Skin: Skin is warm and dry. No rash noted. No erythema.  Incision is well healed Prevena wound vac is removed intact. Steri-strips in place.  Psychiatric: She has a normal mood and affect.        Assessment & Plan:  Postop check - Wound is healing well. Rationale for wound vac reviewed, including increased risk of infection. She understands and desired early removal.   Total face-to-face time with patient: 10 minutes. Over 50% of encounter was spent on counseling and coordination of care. Return in about 2 weeks (around 04/29/2018) for postop check.  Reva Boresanya S Pratt 04/15/2018 2:25 PM

## 2018-04-16 NOTE — Addendum Note (Signed)
Addendum  created 04/16/18 1305 by Donney DiceKelly, Janece Laidlaw D, CRNA   Intraprocedure Event edited

## 2018-04-17 ENCOUNTER — Ambulatory Visit: Payer: 59

## 2018-04-20 ENCOUNTER — Encounter: Payer: Self-pay | Admitting: Obstetrics and Gynecology

## 2018-05-09 ENCOUNTER — Ambulatory Visit (INDEPENDENT_AMBULATORY_CARE_PROVIDER_SITE_OTHER): Payer: 59 | Admitting: Obstetrics and Gynecology

## 2018-05-09 ENCOUNTER — Encounter: Payer: Self-pay | Admitting: Obstetrics and Gynecology

## 2018-05-09 DIAGNOSIS — Z1389 Encounter for screening for other disorder: Secondary | ICD-10-CM | POA: Diagnosis not present

## 2018-05-09 NOTE — Progress Notes (Signed)
Obstetrics and Gynecology Postpartum Visit  Appointment Date: 05/09/2018  OBGYN Clinic: [facility]  Primary Care Provider: Arvin Collardedding, John F. II  Chief Complaint:  Chief Complaint  Patient presents with  . Postpartum Care    History of Present Illness: Mary Campos is a 32 y.o. Caucasian [id number] (No LMP recorded.), seen for the above chief complaint. Her past medical history is significant for BMI 50s.   She is s/p rLTCS on 6/21; she was discharged to home on POD#2  Vaginal bleeding or discharge: No  Breast or formula feeding: breast Intercourse: No  Contraception after delivery: No  PP depression s/s: No  Any bowel or bladder issues: No  Pap smear: no abnormalities (date: 2017)  Review of Systems:  as noted in the History of Present Illness.  Medications Mary GranaJamie Bassin had no medications administered during this visit. Current Outpatient Medications  Medication Sig Dispense Refill  . albuterol (ACCUNEB) 0.63 MG/3ML nebulizer solution Take 3 mLs (0.63 mg total) by nebulization every 6 (six) hours as needed for wheezing. 75 mL 0  . folic acid (FOLVITE) 1 MG tablet Take 1 tablet (1 mg total) daily by mouth. 30 tablet 10  . loratadine (CLARITIN) 10 MG tablet Take 10 mg by mouth every evening.     . ondansetron (ZOFRAN-ODT) 4 MG disintegrating tablet Take 4 mg by mouth every 8 (eight) hours as needed for nausea or vomiting.    . Prenatal Vit-Fe Fumarate-FA (PRENATAL PO) Take 1 tablet by mouth every evening.     . sertraline (ZOLOFT) 50 MG tablet Take 1 tablet (50 mg total) by mouth daily. 60 tablet 2  . albuterol (VENTOLIN HFA) 108 (90 Base) MCG/ACT inhaler Inhale 2 puffs into the lungs every 4 (four) hours as needed for wheezing or shortness of breath. (Patient not taking: Reported on 05/09/2018) 1 Inhaler 3  . Butalbital-APAP-Caffeine 50-325-40 MG capsule Take 1-2 capsules by mouth every 6 (six) hours as needed for headache. (Patient not taking:  Reported on 05/09/2018) 20 capsule 1  . oxyCODONE (OXY IR/ROXICODONE) 5 MG immediate release tablet Take 1 tablet (5 mg total) by mouth every 4 (four) hours as needed (pain scale 4-7). (Patient not taking: Reported on 05/09/2018) 20 tablet 0  . prochlorperazine (COMPAZINE) 10 MG tablet Take 1 tablet (10 mg total) by mouth every 6 (six) hours as needed for nausea or vomiting (headache). (Patient not taking: Reported on 05/09/2018) 30 tablet 3  . promethazine (PHENERGAN) 25 MG tablet Take 1/2 to 1 tablet po every 6 hours as needed for nausea (Patient not taking: Reported on 05/09/2018) 30 tablet 2   No current facility-administered medications for this visit.     Allergies Azithromycin; Eletriptan hydrobromide; Pseudoephedrine; and Tape  Physical Exam:  BP 129/80   Pulse 71   Wt 270 lb 6.4 oz (122.7 kg)   BMI 49.46 kg/m  Body mass index is 49.46 kg/m. General appearance: Well nourished, well developed female in no acute distress.  Respiratory:  Normal respiratory effort Abdomen: obese, soft, nttp, nd, c/d/i incision Neuro/Psych:  Normal mood and affect.  Skin:  Warm and dry.   Laboratory: none  PP Depression Screening:  EPDS 7  Assessment: pt doing well  Plan:  Routine care. Pt breastfeeding at least q2h. Pt unsure about b/c. D/w her re: lactational amenorrhea and contraceptive benefits  RTC PRN  Cornelia Copaharlie Veldon Wager, Jr MD Attending [facility](Faculty Practice)

## 2018-09-29 ENCOUNTER — Other Ambulatory Visit: Payer: Self-pay | Admitting: Obstetrics and Gynecology

## 2018-09-29 DIAGNOSIS — F4321 Adjustment disorder with depressed mood: Secondary | ICD-10-CM

## 2018-09-29 MED ORDER — SERTRALINE HCL 50 MG PO TABS: 50.0000 mg | ORAL_TABLET | Freq: Every day | ORAL | 2 refills | Status: DC

## 2018-11-18 ENCOUNTER — Encounter: Payer: Self-pay | Admitting: Obstetrics and Gynecology

## 2018-11-18 ENCOUNTER — Ambulatory Visit (INDEPENDENT_AMBULATORY_CARE_PROVIDER_SITE_OTHER): Payer: BLUE CROSS/BLUE SHIELD | Admitting: Obstetrics and Gynecology

## 2018-11-18 VITALS — BP 121/88 | HR 72

## 2018-11-18 DIAGNOSIS — Z3009 Encounter for other general counseling and advice on contraception: Secondary | ICD-10-CM | POA: Diagnosis not present

## 2018-11-18 LAB — POCT URINE PREGNANCY: PREG TEST UR: NEGATIVE

## 2018-11-19 NOTE — Progress Notes (Signed)
Obstetrics and Gynecology Visit Return Patient Evaluation  Appointment Date: 11/18/2018  Primary Care Provider: Noni Saupe  OBGYN Clinic: [facility] Complaint: birth control consult  History of Present Illness:  Mary Campos is a 33 y.o. G4P4 with above CC. PMhx significant for BMI 50s, h/o c-section x 4, anx/depression.  Stopped breast feeding a few weeks ago, no period yet. Having intercourse w/o protection, last time yesterday and a few days ago.   Had AUB with provera and nexplanon she said but interested in nexplanon again.    Review of Systems:  as noted in the History of Present Illness.  Medications:  Delsa Grana had no medications administered during this visit. Current Outpatient Medications  Medication Sig Dispense Refill  . albuterol (ACCUNEB) 0.63 MG/3ML nebulizer solution Take 3 mLs (0.63 mg total) by nebulization every 6 (six) hours as needed for wheezing. 75 mL 0  . loratadine (CLARITIN) 10 MG tablet Take 10 mg by mouth every evening.     . sertraline (ZOLOFT) 50 MG tablet Take 1 tablet (50 mg total) by mouth daily. 60 tablet 2  . albuterol (VENTOLIN HFA) 108 (90 Base) MCG/ACT inhaler Inhale 2 puffs into the lungs every 4 (four) hours as needed for wheezing or shortness of breath. (Patient not taking: Reported on 05/09/2018) 1 Inhaler 3   No current facility-administered medications for this visit.     Allergies: is allergic to azithromycin; eletriptan hydrobromide; pseudoephedrine; and tape.  Physical Exam:  BP 121/88   Pulse 72  There is no height or weight on file to calculate BMI. General appearance: Well nourished, well developed female in no acute distress.  Neuro/Psych:  Normal mood and affect.    Labs: UPT negative Assessment: pt doing well  Plan:  1. Birth control counseling Options d/w her and she's unsure if she's done with fertility. Husband wants more kids and she doesn't and he doesn't want  a vasectomy. Encouraged option communication with him re: this. I told her that nexplanon has a high chance of causing AUB but worth a try given surgery could be difficult with her body habitus. Will d/w her when I see her back re: bariatric surgery consult - POCT urine pregnancy   RTC: 2wks for UPT and nexplanon placement  Cornelia Copa MD Attending Center for [facility])

## 2018-12-04 ENCOUNTER — Ambulatory Visit (INDEPENDENT_AMBULATORY_CARE_PROVIDER_SITE_OTHER): Payer: BLUE CROSS/BLUE SHIELD | Admitting: Obstetrics and Gynecology

## 2018-12-04 ENCOUNTER — Encounter: Payer: Self-pay | Admitting: Obstetrics and Gynecology

## 2018-12-04 ENCOUNTER — Telehealth: Payer: Self-pay | Admitting: Obstetrics and Gynecology

## 2018-12-04 VITALS — BP 119/70 | Wt 279.6 lb

## 2018-12-04 DIAGNOSIS — Z3202 Encounter for pregnancy test, result negative: Secondary | ICD-10-CM

## 2018-12-04 DIAGNOSIS — Z30017 Encounter for initial prescription of implantable subdermal contraceptive: Secondary | ICD-10-CM | POA: Diagnosis not present

## 2018-12-04 DIAGNOSIS — F419 Anxiety disorder, unspecified: Secondary | ICD-10-CM

## 2018-12-04 LAB — POCT URINE PREGNANCY: Preg Test, Ur: NEGATIVE

## 2018-12-04 MED ORDER — ETONOGESTREL 68 MG ~~LOC~~ IMPL
68.0000 mg | DRUG_IMPLANT | Freq: Once | SUBCUTANEOUS | Status: AC
  Administered 2018-12-04: 68 mg via SUBCUTANEOUS

## 2018-12-04 NOTE — Progress Notes (Signed)
GYN Note  Patient self d/c'ed zoloft via taper with last dose about two weeks ago b/c she felt depression was much better and didn't need it. Mood is good but she has occasional anxiety episodes and is wondering if there's anything that can be done with this, such as a PRN medication. I told her I'd recommend seeing a counselor to see if they can help with coping mechanisms and she is amenable to this. Referral made to [facility] at the main clinic.  Cornelia Copa MD Attending Center for [employer] (Faculty Practice) 12/04/2018 Time: 11am

## 2018-12-04 NOTE — Telephone Encounter (Signed)
Called patient in regards to making an appointment per [facility]. Left a voicemail message informing the patient to call our clinic.

## 2018-12-04 NOTE — Procedures (Signed)
Nexplanon Insertion Procedure Note Prior to the procedure being performed, the patient (or guardian) was asked to state their full name, date of birth, type of procedure being performed and the exact location of the operative site. This information was then checked against the documentation in the patient's chart. Prior to the procedure being performed, a "time out" was performed by the physician that confirmed the correct patient, procedure and site.  LMP: 11/28/2018. Last intercourse >2wks ago. UPT today negative.   After informed consent was obtained, the patient's non-dominant left arm was chosen for insertion. A site was marked approximately 8 cm proximal to the medial epicondyle in the sulcus between the biceps and triceps on the inner surface. The area was cleaned with alcohol then local anesthesia was infiltrated with 3 ml of 1% lidocaine along the planned insertion track. The area was prepped with betadine. Using sterile technique the Nexplanon device was inserted per manufacturer's guidelines in the subdermal connective tissue using the standard insertion technique without difficulty. Pressure was applied and the insertion site was hemostatic. The presence of the Nexplanon was confirmed immediately after insertion by palpation by both me and the patient and by checking the tip of needle for the absence of the insert.  A pressure dressing was applied.   The patient tolerated the procedure well.  Patient told to wait one week before considering it effective.   Mary Campos, Jr MD Attending [facility](Faculty Practice)

## 2018-12-11 ENCOUNTER — Institutional Professional Consult (permissible substitution): Payer: BLUE CROSS/BLUE SHIELD

## 2019-02-11 ENCOUNTER — Telehealth: Payer: Self-pay | Admitting: *Deleted

## 2019-02-11 MED ORDER — NITROFURANTOIN MONOHYD MACRO 100 MG PO CAPS: 100.0000 mg | ORAL_CAPSULE | Freq: Two times a day (BID) | ORAL | 1 refills | Status: DC

## 2019-02-11 MED ORDER — PHENAZOPYRIDINE HCL 200 MG PO TABS: 200.0000 mg | ORAL_TABLET | Freq: Three times a day (TID) | ORAL | 1 refills | Status: DC | PRN

## 2019-02-11 NOTE — Telephone Encounter (Signed)
Pt sent message stating she is having UTI symptoms, will sen din RX per protocol and inform patient that if she does not get better then we will need to see her in the office.

## 2019-04-16 ENCOUNTER — Other Ambulatory Visit: Payer: Self-pay | Admitting: *Deleted

## 2019-04-16 MED ORDER — NITROFURANTOIN MONOHYD MACRO 100 MG PO CAPS: 100.0000 mg | ORAL_CAPSULE | Freq: Two times a day (BID) | ORAL | 1 refills | Status: DC

## 2019-04-27 DIAGNOSIS — M79672 Pain in left foot: Secondary | ICD-10-CM | POA: Diagnosis not present

## 2019-04-27 DIAGNOSIS — Z6841 Body Mass Index (BMI) 40.0 and over, adult: Secondary | ICD-10-CM | POA: Diagnosis not present

## 2019-06-02 DIAGNOSIS — Z13220 Encounter for screening for lipoid disorders: Secondary | ICD-10-CM | POA: Diagnosis not present

## 2019-06-02 DIAGNOSIS — F4322 Adjustment disorder with anxiety: Secondary | ICD-10-CM | POA: Diagnosis not present

## 2019-06-02 DIAGNOSIS — R635 Abnormal weight gain: Secondary | ICD-10-CM | POA: Diagnosis not present

## 2019-06-02 DIAGNOSIS — Z79899 Other long term (current) drug therapy: Secondary | ICD-10-CM | POA: Diagnosis not present

## 2019-06-05 DIAGNOSIS — Z20828 Contact with and (suspected) exposure to other viral communicable diseases: Secondary | ICD-10-CM | POA: Diagnosis not present

## 2019-06-16 DIAGNOSIS — F4322 Adjustment disorder with anxiety: Secondary | ICD-10-CM | POA: Diagnosis not present

## 2019-06-23 ENCOUNTER — Ambulatory Visit: Payer: BLUE CROSS/BLUE SHIELD | Admitting: Obstetrics & Gynecology

## 2019-07-15 ENCOUNTER — Other Ambulatory Visit: Payer: Self-pay | Admitting: Obstetrics and Gynecology

## 2019-07-22 DIAGNOSIS — J302 Other seasonal allergic rhinitis: Secondary | ICD-10-CM | POA: Diagnosis not present

## 2019-07-22 DIAGNOSIS — R062 Wheezing: Secondary | ICD-10-CM | POA: Diagnosis not present

## 2019-07-22 DIAGNOSIS — Z6841 Body Mass Index (BMI) 40.0 and over, adult: Secondary | ICD-10-CM | POA: Diagnosis not present

## 2019-07-22 DIAGNOSIS — F4322 Adjustment disorder with anxiety: Secondary | ICD-10-CM | POA: Diagnosis not present

## 2019-10-26 ENCOUNTER — Other Ambulatory Visit: Payer: Self-pay

## 2019-10-26 ENCOUNTER — Ambulatory Visit (INDEPENDENT_AMBULATORY_CARE_PROVIDER_SITE_OTHER): Payer: BC Managed Care – PPO | Admitting: Obstetrics & Gynecology

## 2019-10-26 ENCOUNTER — Encounter: Payer: Self-pay | Admitting: Obstetrics & Gynecology

## 2019-10-26 VITALS — BP 133/79 | HR 75 | Ht 62.0 in | Wt 290.0 lb

## 2019-10-26 DIAGNOSIS — Z124 Encounter for screening for malignant neoplasm of cervix: Secondary | ICD-10-CM | POA: Diagnosis not present

## 2019-10-26 DIAGNOSIS — Z3046 Encounter for surveillance of implantable subdermal contraceptive: Secondary | ICD-10-CM

## 2019-10-26 DIAGNOSIS — Z1151 Encounter for screening for human papillomavirus (HPV): Secondary | ICD-10-CM | POA: Diagnosis not present

## 2019-10-26 DIAGNOSIS — Z0000 Encounter for general adult medical examination without abnormal findings: Secondary | ICD-10-CM

## 2019-10-26 DIAGNOSIS — R87612 Low grade squamous intraepithelial lesion on cytologic smear of cervix (LGSIL): Secondary | ICD-10-CM | POA: Insufficient documentation

## 2019-10-26 NOTE — Progress Notes (Addendum)
   Subjective:    Patient ID: Mary Campos, female    DOB: 1986-08-25, 34 y.o.   MRN: [id number]  HPI 34 yo married P1 here to have her 34 yo Nexplanon removed. She attributes her lack of weight loss and spotting to the Nexplanon and wants it removed. She declines alternative forms of contraception at this time, plans to use condoms.   Review of Systems     Objective:   Physical Exam Breathing, conversing, and ambulating normally Well nourished, well hydrated White female, no apparent distress abd- benign Consent was signed and time out was done. Her left arm was prepped with betadine after establishing the position of the Nexplanon. The area was infiltrated with 2 cc of 1% lidocaine. A small incision was made and the intact rod was easily removed and noted to be intact.  A steristrip was placed and her arm was noted to be hemostatic. It was bandaged.  She tolerated the procedure well. Spec exam- normal normal size and shape, anteverted, mobile, non-tender, normal adnexal exam      Assessment & Plan:  Preventative care- pap smear done today She declines a flu vaccine Contraception- rec condoms and MVI in case of pregnancy

## 2019-10-27 DIAGNOSIS — R87612 Low grade squamous intraepithelial lesion on cytologic smear of cervix (LGSIL): Secondary | ICD-10-CM | POA: Diagnosis not present

## 2019-10-30 LAB — CYTOLOGY - PAP
Comment: NEGATIVE
High risk HPV: NEGATIVE

## 2019-11-05 ENCOUNTER — Telehealth: Payer: Self-pay | Admitting: *Deleted

## 2019-11-05 NOTE — Telephone Encounter (Signed)
Left message for pt to call back to go over her pap results.

## 2019-11-05 NOTE — Telephone Encounter (Signed)
-----   Message from Allie Bossier, MD sent at 11/04/2019 11:34 AM EST ----- She needs a colpo. Please let her know. Thanks

## 2019-11-05 NOTE — Telephone Encounter (Signed)
Pt informed of pap results and needing to scheduled a colpo. Appointment made.

## 2019-11-12 ENCOUNTER — Other Ambulatory Visit: Payer: Self-pay | Admitting: Obstetrics and Gynecology

## 2019-11-12 ENCOUNTER — Ambulatory Visit (INDEPENDENT_AMBULATORY_CARE_PROVIDER_SITE_OTHER): Payer: BC Managed Care – PPO | Admitting: Obstetrics and Gynecology

## 2019-11-12 ENCOUNTER — Other Ambulatory Visit: Payer: Self-pay

## 2019-11-12 ENCOUNTER — Encounter: Payer: Self-pay | Admitting: Obstetrics and Gynecology

## 2019-11-12 VITALS — BP 130/82 | HR 72

## 2019-11-12 DIAGNOSIS — R87612 Low grade squamous intraepithelial lesion on cytologic smear of cervix (LGSIL): Secondary | ICD-10-CM

## 2019-11-12 DIAGNOSIS — Z3202 Encounter for pregnancy test, result negative: Secondary | ICD-10-CM | POA: Diagnosis not present

## 2019-11-12 DIAGNOSIS — N870 Mild cervical dysplasia: Secondary | ICD-10-CM

## 2019-11-12 HISTORY — PX: COLPOSCOPY W/ BIOPSY / CURETTAGE: SUR283

## 2019-11-12 LAB — POCT URINE PREGNANCY: Preg Test, Ur: NEGATIVE

## 2019-11-12 NOTE — Procedures (Signed)
Colposcopy Procedure Note  Pre-operative Diagnosis:  1/2021pap LGSIL, HPV negative 2017 pap negative  Post-operative Diagnosis: cin 1  Procedure Details  UPT negative. Patient also denies any tobacco use or 2nd hand smoke exposure.   The risks (including infection, bleeding, pain) and benefits of the procedure were explained to the patient and written informed consent was obtained.  The patient was placed in the dorsal lithotomy position. A Graves was speculum inserted in the vagina, and the cervix was visualized.  AA staining done Lugol's with green filter.  Biopsy from 12 o'clock and then single toothed tenaculum applied and ECC in all four quadrants done. No bleeding after procedure  Findings: circumferential AWE changes  Adequate: Yes  Specimens: 12 o'clock cervical and endocervical curettage  Condition: Stable  Complications: None  Plan: The patient was advised to call for any fever or for prolonged or severe pain or bleeding. She was advised to use OTC analgesics as needed for mild to moderate pain. She was advised to avoid vaginal intercourse for 10 days or until the bleeding has completely stopped.   Cornelia Copa MD Attending Center for [employer] [facility])

## 2019-11-12 NOTE — Patient Instructions (Signed)
Colposcopy, Care After This sheet gives you information about how to care for yourself after your procedure. Your doctor may also give you more specific instructions. If you have problems or questions, contact your doctor. What can I expect after the procedure? If you did not have a tissue sample removed (did not have a biopsy), you may only have some spotting for a few days. You can go back to your normal activities. If you had a tissue sample removed, it is common to have:  Soreness and pain. This may last for a few days.  Light-headedness.  Mild bleeding from your vagina or dark-colored, grainy discharge from your vagina. This may last for a few days. You may need to wear a sanitary pad.  Spotting for at least 48 hours after the procedure. Follow these instructions at home:   Take over-the-counter and prescription medicines only as told by your doctor. Ask your doctor what medicines you can start taking again. This is very important if you take blood-thinning medicine.  Do not drive or use heavy machinery while taking prescription pain medicine.  For 10 days avoid: ? Douching. ? Using tampons. ? Having sex. Contact a doctor if:  You get a skin rash. Get help right away if:  You are bleeding a lot from your vagina. It is a lot of bleeding if you are using more than one pad an hour for 2 hours in a row.  You have clumps of blood (blood clots) coming from your vagina.  You have a fever.  You have chills  You have pain in your lower belly (pelvic area).  You have signs of infection, such as vaginal discharge that is: ? Different than usual. ? Yellow. ? Bad-smelling.  You have very pain or cramps in your lower belly that do not get better with medicine.  You feel light-headed.  You feel dizzy.  You pass out (faint). Summary  If you did not have a tissue sample removed (did not have a biopsy), you may only have some spotting for a few days. You can go back to your  normal activities.  If you had a tissue sample removed, it is common to have mild pain and spotting for 48 hours.  For 3 days, or as long as your doctor tells you, avoid douching, using tampons and having sex.  Get help right away if you have bleeding, very bad pain, or signs of infection. This information is not intended to replace advice given to you by your health care provider. Make sure you discuss any questions you have with your health care provider. Document Revised: 09/20/2017 Document Reviewed: 06/27/2016 Elsevier Patient Education  2020 [employer].

## 2019-11-16 ENCOUNTER — Encounter: Payer: Self-pay | Admitting: Obstetrics and Gynecology

## 2019-11-16 DIAGNOSIS — N879 Dysplasia of cervix uteri, unspecified: Secondary | ICD-10-CM | POA: Insufficient documentation

## 2020-02-24 ENCOUNTER — Encounter: Payer: Self-pay | Admitting: Family Medicine

## 2020-02-24 ENCOUNTER — Other Ambulatory Visit: Payer: Self-pay

## 2020-02-24 ENCOUNTER — Other Ambulatory Visit (HOSPITAL_COMMUNITY)
Admission: RE | Admit: 2020-02-24 | Discharge: 2020-02-24 | Disposition: A | Discharge status: Home / Self Care | Payer: BC Managed Care – PPO | Source: Ambulatory Visit | Attending: Family Medicine | Admitting: Family Medicine

## 2020-02-24 ENCOUNTER — Ambulatory Visit (INDEPENDENT_AMBULATORY_CARE_PROVIDER_SITE_OTHER): Payer: BC Managed Care – PPO | Admitting: Family Medicine

## 2020-02-24 VITALS — BP 149/75 | HR 82 | Wt 294.0 lb

## 2020-02-24 DIAGNOSIS — O3680X0 Pregnancy with inconclusive fetal viability, not applicable or unspecified: Secondary | ICD-10-CM

## 2020-02-24 DIAGNOSIS — O99210 Obesity complicating pregnancy, unspecified trimester: Secondary | ICD-10-CM

## 2020-02-24 DIAGNOSIS — O0991 Supervision of high risk pregnancy, unspecified, first trimester: Secondary | ICD-10-CM | POA: Diagnosis not present

## 2020-02-24 DIAGNOSIS — Z3A12 12 weeks gestation of pregnancy: Secondary | ICD-10-CM

## 2020-02-24 DIAGNOSIS — O0990 Supervision of high risk pregnancy, unspecified, unspecified trimester: Secondary | ICD-10-CM | POA: Insufficient documentation

## 2020-02-24 DIAGNOSIS — Z8279 Family history of other congenital malformations, deformations and chromosomal abnormalities: Secondary | ICD-10-CM

## 2020-02-24 DIAGNOSIS — O219 Vomiting of pregnancy, unspecified: Secondary | ICD-10-CM

## 2020-02-24 LAB — COMPREHENSIVE METABOLIC PANEL
ALT: 17 IU/L (ref 0–32)
AST: 14 IU/L (ref 0–40)
Albumin/Globulin Ratio: 1.7 (ref 1.2–2.2)
Albumin: 3.7 g/dL — ABNORMAL LOW (ref 3.8–4.8)
Alkaline Phosphatase: 115 IU/L (ref 39–117)
BUN/Creatinine Ratio: 16 (ref 9–23)
BUN: 13 mg/dL (ref 6–20)
Bilirubin Total: 0.4 mg/dL (ref 0.0–1.2)
CO2: 21 mmol/L (ref 20–29)
Calcium: 9.1 mg/dL (ref 8.7–10.2)
Chloride: 105 mmol/L (ref 96–106)
Creatinine, Ser: 0.8 mg/dL (ref 0.57–1.00)
GFR calc Af Amer: 114 mL/min/{1.73_m2} (ref >59)
GFR calc non Af Amer: 99 mL/min/{1.73_m2} (ref >59)
Globulin, Total: 2.2 g/dL (ref 1.5–4.5)
Glucose: 94 mg/dL (ref 65–99)
Potassium: 3.9 mmol/L (ref 3.5–5.2)
Sodium: 140 mmol/L (ref 134–144)
Total Protein: 5.9 g/dL — ABNORMAL LOW (ref 6.0–8.5)

## 2020-02-24 MED ORDER — PROCHLORPERAZINE MALEATE 10 MG PO TABS: 10.0000 mg | ORAL_TABLET | Freq: Four times a day (QID) | ORAL | 3 refills | Status: DC | PRN

## 2020-02-24 MED ORDER — ONDANSETRON 4 MG PO TBDP: 4.0000 mg | ORAL_TABLET | Freq: Four times a day (QID) | ORAL | 0 refills | Status: DC | PRN

## 2020-02-24 MED ORDER — ASPIRIN EC 81 MG PO TBEC: 81.0000 mg | DELAYED_RELEASE_TABLET | Freq: Every day | ORAL | 2 refills | Status: DC

## 2020-02-24 MED ORDER — FOLIC ACID 1 MG PO TABS: 1.0000 mg | ORAL_TABLET | Freq: Every day | ORAL | 10 refills | Status: DC

## 2020-02-24 MED ORDER — PROMETHAZINE HCL 25 MG PO TABS: 25.0000 mg | ORAL_TABLET | Freq: Four times a day (QID) | ORAL | 2 refills | Status: DC | PRN

## 2020-02-24 NOTE — Progress Notes (Signed)
DATING AND VIABILITY SONOGRAM   Mary Campos is a 34 y.o. year old G34P4004 with LMP Patient's last menstrual period was 11/29/2019 (approximate). which would correlate to  [redacted]w[redacted]d weeks gestation.  She has irregular menstrual cycles.   She is here today for a confirmatory initial sonogram.    GESTATION: SINGLETON yes     FETAL ACTIVITY:          Heart rate       166          The fetus is active.   GESTATIONAL AGE AND  BIOMETRICS:  Gestational criteria: Estimated Date of Delivery: 09/04/20 by LMP now at [redacted]w[redacted]d  Previous Scans:0  GESTATIONAL SAC            mm          weeks  CROWN RUMP LENGTH           5.93 mm         12.2 weeks                                                   AVERAGE EGA(BY THIS SCAN):  12.2 weeks  WORKING EDD( LMP ):  09/04/2020     TECHNICIAN COMMENTS:  Patient informed that the ultrasound is considered a limited obstetric ultrasound and is not intended to be a complete ultrasound exam. Patient also informed that the ultrasound is not being completed with the intent of assessing for fetal or placental anomalies or any pelvic abnormalities. Explained that the purpose of today's ultrasound is to assess for fetal heart rate. Patient acknowledges the purpose of the exam and the limitations of the study.        A copy of this report including all images has been saved and backed up to a second source for retrieval if needed. All measures and details of the anatomical scan, placentation, fluid volume and pelvic anatomy are contained in that report.  Scheryl Marten 02/24/2020 4:37 PM

## 2020-02-24 NOTE — Progress Notes (Signed)
Initial PRENATAL VISIT NOTE  Subjective:  Mary Campos is a 34 y.o. G5P4004 at [redacted]w[redacted]d being seen today for ongoing prenatal care.  She is currently monitored for the following issues for this high-risk pregnancy and has Anxiety state; Asthma; History of gestational hypertension; Maternal obesity, antepartum; BMI 50.0-59.9, adult (HCC); History of postpartum depression; Cervical dysplasia; Supervision of high risk pregnancy, antepartum; Rh negative, antepartum; and Family history of spina bifida on their problem list.  Patient reports no complaints.  Reports having nexplanon removed and likley became pregnant shortly after that but BSUS shows more advanced GA. Reports recently starting PNV. She wants to have a BTS with this now 5th CS. She was on the fence last time.  Contractions: Not present. Vag. Bleeding: None.   . Denies leaking of fluid.   Indications for ASA therapy (per uptodate) One of the following: Previous pregnancy with preeclampsia, especially early onset and with an adverse outcome No Multifetal gestation No Chronic hypertension No Type 1 or 2 diabetes mellitus No Chronic kidney disease No Autoimmune disease (antiphospholipid syndrome, systemic lupus erythematosus) No  Two or more of the following: Nulliparity No Obesity (body mass index >30 kg/m2) Yes Family history of preeclampsia in mother or sister Yes- sister Age ?35 years No Sociodemographic characteristics (African American race, low socioeconomic level) No Personal risk factors (eg, previous pregnancy with low birth weight or small for gestational age infant, previous adverse pregnancy outcome [eg, stillbirth], interval >10 years between pregnancies) No  Indications for early GDM screening  First-degree relative with diabetes No BMI >30kg/m2 Yes Age > 25 Yes Previous birth of an infant weighing ?4000 g No Gestational diabetes mellitus in a previous pregnancy No Glycated hemoglobin ?5.7 percent (39 mmol/mol),  impaired glucose tolerance, or impaired fasting glucose on previous testing No High-risk race/ethnicity (eg, African American, Latino, Native American, Asian American, Pacific Islander) No Previous stillbirth of unknown cause No Maternal birthweight > 9 lbs No History of cardiovascular disease No Hypertension or on therapy for hypertension No High-density lipoprotein cholesterol level <35 mg/dL (4.16 mmol/L) and/or a triglyceride level >250 mg/dL (6.06 mmol/L) No Polycystic ovary syndrome No Physical inactivity Yes Other clinical condition associated with insulin resistance (eg, severe obesity, acanthosis nigricans) Yes Current use of glucocorticoids No   Early screening tests: FBS, A1C, Random CBG, glucose challenge The following portions of the patient's history were reviewed and updated as appropriate: allergies, current medications, past family history, past medical history, past social history, past surgical history and problem list.   Objective:   Vitals:   02/24/20 1554  BP: (!) 149/75  Pulse: 82  Weight: 294 lb (133.4 kg)    Fetal Status: Fetal Heart Rate (bpm): 166         General:  Alert, oriented and cooperative. Patient is in no acute distress.  Skin: Skin is warm and dry. No rash noted.   Cardiovascular: Normal heart rate noted  Respiratory: Normal respiratory effort, no problems with respiration noted  Abdomen: Soft, gravid, appropriate for gestational age.  Pain/Pressure: Absent     Pelvic: Cervical exam deferred        Extremities: Normal range of motion.     Mental Status: Normal mood and affect. Normal behavior. Normal judgment and thought content.   Assessment and Plan:  Pregnancy: G5P4004 at [redacted]w[redacted]d  1. Supervision of high risk pregnancy, antepartum  Reviewed nature of [facility] with shared care with residents, midwives, FM-OB and OB GYN.  Has seen Dr. Vergie Living in the past and reviewed  that she can and will see Dr. Ilda Basset this pregnancy as well and can choose to  have her next RCS with him even though we are meeting today. She was reassured by this.   Reviewed cadence of visits and use of telehealth visits with home BP monitoring  Discussed BTS in detail and patient wants this with this CS (her 5th). Reviewed bilateral salpingectomy, ligation and filshie clips regarding effectiveness and added benefit of ovarian cancer protection with salpingectomy. Discussed how scar tissue will be the biggest determining factor for her which can be used. Last op note is notable for "No intra abdominal adhesions" which is fortunate   - Obstetric Panel, Including HIV - Culture, OB Urine - GC/Chlamydia probe amp ([place])not at [facility] - Comprehensive metabolic panel - Hemoglobin A1c - Protein / creatinine ratio, urine - TSH - Hepatitis C antibody - Hgb Fractionation Cascade - aspirin EC 81 MG tablet; Take 1 tablet (81 mg total) by mouth daily. Take after 12 weeks for prevention of preeclampsia later in pregnancy  Dispense: 300 tablet; Refill: 2 - folic acid (FOLVITE) 1 MG tablet; Take 1 tablet (1 mg total) by mouth daily.  Dispense: 30 tablet; Refill: 10 - [place] MFM OB COMP + 14 WK; Future  2. Maternal obesity, antepartum BMI=51 TWG recommended 11-15lb Recommended ASA and patient agreed to start Random glucose 94 and HA1C 5.0%  3. Nausea/vomiting in pregnancy Patient has regimen that has worked in the past-- rx given today - prochlorperazine (COMPAZINE) 10 MG tablet; Take 1 tablet (10 mg total) by mouth every 6 (six) hours as needed for nausea or vomiting.  Dispense: 30 tablet; Refill: 3 - ondansetron (ZOFRAN ODT) 4 MG disintegrating tablet; Take 1 tablet (4 mg total) by mouth every 6 (six) hours as needed for nausea.  Dispense: 20 tablet; Refill: 0 - promethazine (PHENERGAN) 25 MG tablet; Take 1 tablet (25 mg total) by mouth every 6 (six) hours as needed for nausea or vomiting.  Dispense: 30 tablet; Refill: 2  4. Family history of spina bifida - has been  taking her extra folate from prior pregnancy - Refilled Folic Acid 1mg    5. Abnormal pap smear - LSIL on 10/26/2019 pap--> colpo 11/12/19 showed CIN1 - Repeat pap in Jan 2022  Preterm labor symptoms and general obstetric precautions including but not limited to vaginal bleeding, contractions, leaking of fluid and fetal movement were reviewed in detail with the patient. Please refer to After Visit Summary for other counseling recommendations.   Return in about 4 weeks (around 03/23/2020) for Routine prenatal care, Telehealth/Virtual health OB Visit.  Future Appointments  Date Time Provider [place]  03/23/2020  1:30 PM Caren Macadam, MD [facility]  04/11/2020  9:45 AM [facility] US4 [facility]    Caren Macadam, MD

## 2020-02-25 ENCOUNTER — Encounter: Payer: Self-pay | Admitting: Family Medicine

## 2020-02-25 DIAGNOSIS — Z6791 Unspecified blood type, Rh negative: Secondary | ICD-10-CM | POA: Insufficient documentation

## 2020-02-25 LAB — TSH: TSH: 0.805 u[IU]/mL (ref 0.450–4.500)

## 2020-02-25 LAB — OBSTETRIC PANEL, INCLUDING HIV
Antibody Screen: NEGATIVE
Basophils Absolute: 0 10*3/uL (ref 0.0–0.2)
Basos: 0 %
EOS (ABSOLUTE): 0.1 10*3/uL (ref 0.0–0.4)
Eos: 1 %
HIV Screen 4th Generation wRfx: NONREACTIVE
Hematocrit: 36.8 % (ref 34.0–46.6)
Hemoglobin: 12.5 g/dL (ref 11.1–15.9)
Hepatitis B Surface Ag: NEGATIVE
Immature Grans (Abs): 0 10*3/uL (ref 0.0–0.1)
Immature Granulocytes: 0 %
Lymphocytes Absolute: 1.9 10*3/uL (ref 0.7–3.1)
Lymphs: 22 %
MCH: 29.9 pg (ref 26.6–33.0)
MCHC: 34 g/dL (ref 31.5–35.7)
MCV: 88 fL (ref 79–97)
Monocytes Absolute: 0.4 10*3/uL (ref 0.1–0.9)
Monocytes: 4 %
Neutrophils Absolute: 6.2 10*3/uL (ref 1.4–7.0)
Neutrophils: 73 %
Platelets: 305 10*3/uL (ref 150–450)
RBC: 4.18 x10E6/uL (ref 3.77–5.28)
RDW: 13 % (ref 11.7–15.4)
RPR Ser Ql: NONREACTIVE
Rh Factor: POSITIVE
Rubella Antibodies, IGG: 2.33 index (ref >0.99)
WBC: 8.6 10*3/uL (ref 3.4–10.8)

## 2020-02-25 LAB — HEPATITIS C ANTIBODY: Hep C Virus Ab: <0.1 s/co ratio (ref 0.0–0.9)

## 2020-02-25 LAB — URINE CULTURE, OB REFLEX

## 2020-02-25 LAB — HEMOGLOBIN A1C
Est. average glucose Bld gHb Est-mCnc: 97 mg/dL
Hgb A1c MFr Bld: 5 % (ref 4.8–5.6)

## 2020-02-25 LAB — PROTEIN / CREATININE RATIO, URINE
Creatinine, Urine: 274.5 mg/dL
Protein, Ur: 17.5 mg/dL
Protein/Creat Ratio: 64 mg/g creat (ref 0–200)

## 2020-02-25 LAB — CULTURE, OB URINE

## 2020-02-26 LAB — HGB FRACTIONATION CASCADE
Hgb A2: 2.5 % (ref 1.8–3.2)
Hgb A: 97.5 % (ref 96.4–98.8)
Hgb F: 0 % (ref 0.0–2.0)
Hgb S: 0 %

## 2020-02-26 LAB — GC/CHLAMYDIA PROBE AMP (~~LOC~~) NOT AT ARMC
Chlamydia: NEGATIVE
Comment: NEGATIVE
Comment: NORMAL
Neisseria Gonorrhea: NEGATIVE

## 2020-02-27 ENCOUNTER — Encounter: Payer: Self-pay | Admitting: Family Medicine

## 2020-02-27 DIAGNOSIS — Z8279 Family history of other congenital malformations, deformations and chromosomal abnormalities: Secondary | ICD-10-CM | POA: Insufficient documentation

## 2020-03-01 ENCOUNTER — Encounter: Payer: BC Managed Care – PPO | Admitting: Family Medicine

## 2020-03-23 ENCOUNTER — Telehealth (INDEPENDENT_AMBULATORY_CARE_PROVIDER_SITE_OTHER): Payer: BC Managed Care – PPO | Admitting: Family Medicine

## 2020-03-23 ENCOUNTER — Other Ambulatory Visit: Payer: Self-pay

## 2020-03-23 ENCOUNTER — Encounter: Payer: Self-pay | Admitting: Family Medicine

## 2020-03-23 VITALS — BP 123/59

## 2020-03-23 DIAGNOSIS — E669 Obesity, unspecified: Secondary | ICD-10-CM

## 2020-03-23 DIAGNOSIS — O99212 Obesity complicating pregnancy, second trimester: Secondary | ICD-10-CM

## 2020-03-23 DIAGNOSIS — Z3A16 16 weeks gestation of pregnancy: Secondary | ICD-10-CM

## 2020-03-23 DIAGNOSIS — O26899 Other specified pregnancy related conditions, unspecified trimester: Secondary | ICD-10-CM

## 2020-03-23 DIAGNOSIS — Z6841 Body Mass Index (BMI) 40.0 and over, adult: Secondary | ICD-10-CM

## 2020-03-23 DIAGNOSIS — O0992 Supervision of high risk pregnancy, unspecified, second trimester: Secondary | ICD-10-CM

## 2020-03-23 DIAGNOSIS — Z8279 Family history of other congenital malformations, deformations and chromosomal abnormalities: Secondary | ICD-10-CM

## 2020-03-23 DIAGNOSIS — O0990 Supervision of high risk pregnancy, unspecified, unspecified trimester: Secondary | ICD-10-CM

## 2020-03-23 NOTE — Progress Notes (Signed)
I connected with  Mary Campos on 03/23/20 at  1:30 PM EDT by telephone and verified that I am speaking with the correct person using two identifiers.   I discussed the limitations, risks, security and privacy concerns of performing an evaluation and management service by telephone and the availability of in person appointments. I also discussed with the patient that there may be a patient responsible charge related to this service. The patient expressed understanding and agreed to proceed.  Scheryl Marten, RN 03/23/2020  1:21 PM     Will send a BP later in mychart

## 2020-03-23 NOTE — Progress Notes (Signed)
I connected with@ on 03/23/20 at  1:30 PM EDT by: MyChart video and verified that I am speaking with the correct person using two identifiers.  Patient is located at home and provider is located at [facility].     The purpose of this virtual visit is to provide medical care while limiting exposure to the novel coronavirus. I discussed the limitations, risks, security and privacy concerns of performing an evaluation and management service by MyChart video and the availability of in person appointments. I also discussed with the patient that there may be a patient responsible charge related to this service. By engaging in this virtual visit, you consent to the provision of healthcare.  Additionally, you authorize for your insurance to be billed for the services provided during this visit.  The patient expressed understanding and agreed to proceed.  The following staff members participated in the virtual visit:  Scheryl Marten RN    PRENATAL VISIT NOTE  Subjective:  Jamillah Camilo is a 34 y.o. G5P4004 at [redacted]w[redacted]d  for phone visit for ongoing prenatal care.  She is currently monitored for the following issues for this high-risk pregnancy and has Anxiety state; Asthma; History of gestational hypertension; Maternal obesity, antepartum; BMI 50.0-59.9, adult (HCC); History of postpartum depression; Cervical dysplasia; Supervision of high risk pregnancy, antepartum; Rh negative, antepartum; and Family history of spina bifida on their problem list.  Patient reports no complaints.  Contractions: Not present. Vag. Bleeding: None.   . Denies leaking of fluid.   The following portions of the patient's history were reviewed and updated as appropriate: allergies, current medications, past family history, past medical history, past social history, past surgical history and problem list.   Objective:  There were no vitals filed for this visit. Self-Obtained  Fetal Status:           Assessment and Plan:  Pregnancy:  G5P4004 at [redacted]w[redacted]d 1. Family history of spina bifida Taking folic acid  2. Rh negative, antepartum Rhogam at 28 wks  3. Supervision of high risk pregnancy, antepartum No complaints Has [place] scheduled Started ASA Reviewed timing of next appt and [place] Will send BP through mychart for review  4. Nausea/vomitting - improved since last visit - Taking compazine prn  Preterm labor symptoms and general obstetric precautions including but not limited to vaginal bleeding, contractions, leaking of fluid and fetal movement were reviewed in detail with the patient.  Return in about 4 weeks (around 04/20/2020) for Routine prenatal care, Telehealth/Virtual health OB Visit.  Future Appointments  Date Time Provider Department Center  04/11/2020  9:45 AM [facility] US4 [facility]  04/20/2020  2:00 PM Federico Flake, MD [facility]     Time spent on virtual visit: 15 minutes  Federico Flake, MD

## 2020-03-29 DIAGNOSIS — J0110 Acute frontal sinusitis, unspecified: Secondary | ICD-10-CM

## 2020-03-31 MED ORDER — AMOXICILLIN-POT CLAVULANATE 875-125 MG PO TABS: 1.0000 | ORAL_TABLET | Freq: Two times a day (BID) | ORAL | 0 refills | Status: AC

## 2020-04-04 ENCOUNTER — Ambulatory Visit
Admission: EM | Admit: 2020-04-04 | Discharge: 2020-04-04 | Disposition: A | Discharge status: Home / Self Care | Payer: BC Managed Care – PPO | Attending: Emergency Medicine | Admitting: Emergency Medicine

## 2020-04-04 ENCOUNTER — Telehealth: Payer: Self-pay | Admitting: *Deleted

## 2020-04-04 ENCOUNTER — Ambulatory Visit
Admission: RE | Admit: 2020-04-04 | Discharge: 2020-04-04 | Disposition: A | Discharge status: Home / Self Care | Payer: BC Managed Care – PPO | Source: Ambulatory Visit | Attending: Emergency Medicine | Admitting: Emergency Medicine

## 2020-04-04 ENCOUNTER — Encounter: Payer: Self-pay | Admitting: Emergency Medicine

## 2020-04-04 ENCOUNTER — Ambulatory Visit (INDEPENDENT_AMBULATORY_CARE_PROVIDER_SITE_OTHER): Payer: BC Managed Care – PPO | Admitting: *Deleted

## 2020-04-04 ENCOUNTER — Other Ambulatory Visit: Payer: Self-pay

## 2020-04-04 ENCOUNTER — Ambulatory Visit
Admission: RE | Admit: 2020-04-04 | Discharge: 2020-04-04 | Disposition: A | Discharge status: Home / Self Care | Payer: BC Managed Care – PPO | Attending: Emergency Medicine | Admitting: Emergency Medicine

## 2020-04-04 VITALS — BP 137/77 | HR 82

## 2020-04-04 DIAGNOSIS — J984 Other disorders of lung: Secondary | ICD-10-CM | POA: Diagnosis not present

## 2020-04-04 DIAGNOSIS — Z0130 Encounter for examination of blood pressure without abnormal findings: Secondary | ICD-10-CM

## 2020-04-04 DIAGNOSIS — R05 Cough: Secondary | ICD-10-CM | POA: Diagnosis not present

## 2020-04-04 DIAGNOSIS — Z3A18 18 weeks gestation of pregnancy: Secondary | ICD-10-CM

## 2020-04-04 DIAGNOSIS — O26892 Other specified pregnancy related conditions, second trimester: Secondary | ICD-10-CM | POA: Insufficient documentation

## 2020-04-04 DIAGNOSIS — R059 Cough, unspecified: Secondary | ICD-10-CM

## 2020-04-04 DIAGNOSIS — O0990 Supervision of high risk pregnancy, unspecified, unspecified trimester: Secondary | ICD-10-CM

## 2020-04-04 NOTE — Telephone Encounter (Signed)
Called pt back regarding her messages and that Dr Alvester Morin recommends going to an urgent care incase she does need a chest xray, they can do it there. Pt verbalizes and understands.

## 2020-04-04 NOTE — Progress Notes (Signed)
Pt came by office requesting a BP and FHR check.   BP in office was 137/77 and FHR was 156.   Pt to follow up as needed or at next ROB.

## 2020-04-04 NOTE — ED Provider Notes (Signed)
Mary Campos    CSN: [id number] Arrival date & time: 04/04/20  1306      History   Chief Complaint Chief Complaint  Patient presents with  . Chest congestion  . Facial Pain    HPI Mary Campos is a 34 y.o. female.   Patient presents with chest congestion and cough productive of brown-yellow phlegm.  She is [redacted] weeks pregnant and is currently on Augmentin which was called in by her OB on 6/102021.  Patient contacted her OB today to say that she was still coughing and her OB sent her here for a chest x-ray.  Patient states her nasal congestion has improved.  Patient denies fever, chills, abdominal pain, shortness of breath, or other symptoms.  Her medical history includes asthma; she has been using her albuterol inhaler with minimal relief.  The history is provided by the patient.    Past Medical History:  Diagnosis Date  . Asthma    albuteral inhaler PRN  . Depression    hx mild ppd    Patient Active Problem List   Diagnosis Date Noted  . Family history of spina bifida 02/27/2020  . Rh negative, antepartum 02/25/2020  . Supervision of high risk pregnancy, antepartum 02/24/2020  . Cervical dysplasia 11/16/2019  . History of postpartum depression 04/11/2018  . BMI 50.0-59.9, adult ([place]) 02/21/2018  . Maternal obesity, antepartum 08/29/2017  . History of gestational hypertension 08/26/2017  . Anxiety state 08/11/2010  . Asthma 08/11/2010    Past Surgical History:  Procedure Laterality Date  . CESAREAN SECTION     X 2  . CESAREAN SECTION N/A 04/11/2018   Procedure: REPEAT CESAREAN SECTION;  Surgeon: Aletha Halim, MD;  Location: [facility];  Service: Obstetrics;  Laterality: N/A;  . CESAREAN SECTION WITH BILATERAL TUBAL LIGATION Bilateral 07/03/2016   Procedure: CESAREAN SECTION/ Female 8lbs. 3oz;  Surgeon: Gae Dry, MD;  Location: [facility];  Service: Obstetrics;  Laterality: Bilateral;  . COLPOSCOPY W/ BIOPSY / CURETTAGE  11/12/2019      .  TONSILLECTOMY      OB History    Gravida  5   Para  4   Term  4   Preterm  0   AB  0   Living  4     SAB  0   TAB  0   Ectopic  0   Multiple  0   Live Births  4            Home Medications    Prior to Admission medications   Medication Sig Start Date End Date Taking? Authorizing Provider  albuterol (ACCUNEB) 0.63 MG/3ML nebulizer solution Take 3 mLs (0.63 mg total) by nebulization every 6 (six) hours as needed for wheezing. 01/27/18   Malachy Mood, MD  albuterol (VENTOLIN HFA) 108 (90 Base) MCG/ACT inhaler Inhale 2 puffs into the lungs every 4 (four) hours as needed for wheezing or shortness of breath. Patient not taking: Reported on 05/09/2018 01/27/18   Malachy Mood, MD  amoxicillin-clavulanate (AUGMENTIN) 875-125 MG tablet Take 1 tablet by mouth 2 (two) times daily for 7 days. 03/31/20 04/07/20  Caren Macadam, MD  aspirin EC 81 MG tablet Take 1 tablet (81 mg total) by mouth daily. Take after 12 weeks for prevention of preeclampsia later in pregnancy 02/24/20   Caren Macadam, MD  folic acid (FOLVITE) 1 MG tablet Take 1 tablet (1 mg total) by mouth daily. 02/24/20   Caren Macadam, MD  loratadine (CLARITIN) 10 MG tablet Take 10 mg by mouth every evening.     [provider]  ondansetron (ZOFRAN ODT) 4 MG disintegrating tablet Take 1 tablet (4 mg total) by mouth every 6 (six) hours as needed for nausea. 02/24/20   Federico Flake, MD  Prenatal Vit-Fe Fumarate-FA (PRENATAL PO) Take 1 tablet by mouth every evening.     [provider]  prochlorperazine (COMPAZINE) 10 MG tablet Take 1 tablet (10 mg total) by mouth every 6 (six) hours as needed for nausea or vomiting. 02/24/20   Federico Flake, MD  promethazine (PHENERGAN) 25 MG tablet Take 1/2 to 1 tablet po every 6 hours as needed for nausea Patient not taking: Reported on 05/09/2018 09/05/17   Nadara Mustard, MD  promethazine (PHENERGAN) 25 MG tablet Take 1 tablet  (25 mg total) by mouth every 6 (six) hours as needed for nausea or vomiting. 02/24/20   Federico Flake, MD    Family History Family History  Problem Relation Age of Onset  . Hypertension Mother     Social History Social History   Tobacco Use  . Smoking status: Never Smoker  . Smokeless tobacco: Never Used  Vaping Use  . Vaping Use: Never used  Substance Use Topics  . Alcohol use: No  . Drug use: No     Allergies   Azithromycin, Eletriptan hydrobromide, Pseudoephedrine, and Tape   Review of Systems Review of Systems  Constitutional: Negative for chills and fever.  HENT: Positive for congestion. Negative for ear pain and sore throat.   Eyes: Negative for pain and visual disturbance.  Respiratory: Positive for cough. Negative for shortness of breath.   Cardiovascular: Negative for chest pain and palpitations.  Gastrointestinal: Negative for abdominal pain and vomiting.  Genitourinary: Negative for dysuria and hematuria.  Musculoskeletal: Negative for arthralgias and back pain.  Skin: Negative for color change and rash.  Neurological: Negative for seizures and syncope.  All other systems reviewed and are negative.    Physical Exam Triage Vital Signs ED Triage Vitals  Enc Vitals Group     BP --      Pulse Rate 04/04/20 1313 83     Resp 04/04/20 1313 18     Temp 04/04/20 1313 99.4 F (37.4 C)     Temp Source 04/04/20 1313 Oral     SpO2 --      Weight 04/04/20 1317 194 lb (88 kg)     Height --      Head Circumference --      Peak Flow --      Pain Score 04/04/20 1317 0     Pain Loc --      Pain Edu? --      Excl. in GC? --    No data found.  Updated Vital Signs BP 110/74 (BP Location: Left Arm)   Pulse 83   Temp 99.4 F (37.4 C) (Oral)   Resp 18   Wt 194 lb (88 kg)   LMP 11/29/2019 (Approximate)   SpO2 98%   BMI 35.48 kg/m   Visual Acuity Right Eye Distance:   Left Eye Distance:   Bilateral Distance:    Right Eye Near:   Left Eye Near:     Bilateral Near:     Physical Exam Vitals and nursing note reviewed.  Constitutional:      General: She is not in acute distress.    Appearance: She is well-developed. She is obese. She is not  ill-appearing.  HENT:     Head: Normocephalic and atraumatic.     Right Ear: Tympanic membrane normal.     Left Ear: Tympanic membrane normal.     Nose: Nose normal.     Mouth/Throat:     Mouth: Mucous membranes are moist.     Pharynx: Oropharynx is clear.  Eyes:     Conjunctiva/sclera: Conjunctivae normal.  Cardiovascular:     Rate and Rhythm: Normal rate and regular rhythm.     Heart sounds: No murmur heard.   Pulmonary:     Effort: Pulmonary effort is normal. No respiratory distress.     Breath sounds: Rhonchi present.     Comments: Scattered rhonchi which partially clear with cough.  Abdominal:     Palpations: Abdomen is soft.     Tenderness: There is no abdominal tenderness.  Musculoskeletal:     Cervical back: Neck supple.  Skin:    General: Skin is warm and dry.     Findings: No rash.  Neurological:     General: No focal deficit present.     Mental Status: She is alert and oriented to person, place, and time.     Gait: Gait normal.  Psychiatric:        Mood and Affect: Mood normal.        Behavior: Behavior normal.      UC Treatments / Results  Labs (all labs ordered are listed, but only abnormal results are displayed) Labs Reviewed - No data to display  EKG   Radiology DG Chest 2 View  Result Date: 04/04/2020 CLINICAL DATA:  Productive cough. EXAM: CHEST - 2 VIEW COMPARISON:  None. FINDINGS: The heart size and mediastinal contours are within normal limits. Both lungs are clear. The visualized skeletal structures are unremarkable. IMPRESSION: No active cardiopulmonary disease. Electronically Signed   By: Lupita Raider M.D.   On: 04/04/2020 14:31    Procedures Procedures (including critical care time)  Medications Ordered in UC Medications - No data to  display  Initial Impression / Assessment and Plan / UC Course  I have reviewed the triage vital signs and the nursing notes.  Pertinent labs & imaging results that were available during my care of the patient were reviewed by me and considered in my medical decision making (see chart for details).   Cough.  [redacted] weeks pregnant.  I discussed this risks vs benefits of xray while pregnant; patient would like to have xray.  CXR negative; discussed results with patient.  Instructed patient to continue taking Augmentin and using her albuterol inhaler as prescribed.  Instructed her to follow-up with her obstetrician as scheduled.  Patient agrees to plan of care.       Final Clinical Impressions(s) / UC Diagnoses   Final diagnoses:  Cough  [redacted] weeks gestation of pregnancy     Discharge Instructions     Go to [facility] for your chest xray.       ED Prescriptions    None     PDMP not reviewed this encounter.   Mickie Bail, NP 04/04/20 1435

## 2020-04-04 NOTE — Discharge Instructions (Addendum)
Go to [facility] for your chest xray.

## 2020-04-04 NOTE — ED Triage Notes (Signed)
Patient in office today c/o chest congestion, facial pain and ear pain. Was prescribed Augmentin via OB on Friday but has not helped per patient. Was informed to see if she can have a chest xray  MAY:OKHTXHF Dm, robitussin  Denies: fever

## 2020-04-11 ENCOUNTER — Ambulatory Visit: Payer: BC Managed Care – PPO | Attending: Obstetrics and Gynecology

## 2020-04-11 ENCOUNTER — Other Ambulatory Visit: Payer: Self-pay | Admitting: *Deleted

## 2020-04-11 ENCOUNTER — Other Ambulatory Visit: Payer: Self-pay

## 2020-04-11 ENCOUNTER — Other Ambulatory Visit: Payer: Self-pay | Admitting: Family Medicine

## 2020-04-11 DIAGNOSIS — O34219 Maternal care for unspecified type scar from previous cesarean delivery: Secondary | ICD-10-CM | POA: Diagnosis not present

## 2020-04-11 DIAGNOSIS — O09292 Supervision of pregnancy with other poor reproductive or obstetric history, second trimester: Secondary | ICD-10-CM

## 2020-04-11 DIAGNOSIS — O0990 Supervision of high risk pregnancy, unspecified, unspecified trimester: Secondary | ICD-10-CM

## 2020-04-11 DIAGNOSIS — O2692 Pregnancy related conditions, unspecified, second trimester: Secondary | ICD-10-CM | POA: Diagnosis not present

## 2020-04-11 DIAGNOSIS — J45909 Unspecified asthma, uncomplicated: Secondary | ICD-10-CM

## 2020-04-11 DIAGNOSIS — Z3A19 19 weeks gestation of pregnancy: Secondary | ICD-10-CM

## 2020-04-11 DIAGNOSIS — O99212 Obesity complicating pregnancy, second trimester: Secondary | ICD-10-CM

## 2020-04-11 DIAGNOSIS — O360120 Maternal care for anti-D [Rh] antibodies, second trimester, not applicable or unspecified: Secondary | ICD-10-CM | POA: Diagnosis not present

## 2020-04-11 DIAGNOSIS — Z8279 Family history of other congenital malformations, deformations and chromosomal abnormalities: Secondary | ICD-10-CM

## 2020-04-11 DIAGNOSIS — O99891 Other specified diseases and conditions complicating pregnancy: Secondary | ICD-10-CM

## 2020-04-11 DIAGNOSIS — E669 Obesity, unspecified: Secondary | ICD-10-CM

## 2020-04-11 DIAGNOSIS — Z362 Encounter for other antenatal screening follow-up: Secondary | ICD-10-CM

## 2020-04-20 ENCOUNTER — Ambulatory Visit (INDEPENDENT_AMBULATORY_CARE_PROVIDER_SITE_OTHER): Payer: BC Managed Care – PPO | Admitting: Family Medicine

## 2020-04-20 ENCOUNTER — Other Ambulatory Visit: Payer: Self-pay

## 2020-04-20 ENCOUNTER — Encounter: Payer: Self-pay | Admitting: Family Medicine

## 2020-04-20 VITALS — BP 129/83 | HR 102 | Wt 293.0 lb

## 2020-04-20 DIAGNOSIS — O99212 Obesity complicating pregnancy, second trimester: Secondary | ICD-10-CM

## 2020-04-20 DIAGNOSIS — O99210 Obesity complicating pregnancy, unspecified trimester: Secondary | ICD-10-CM

## 2020-04-20 DIAGNOSIS — Z8759 Personal history of other complications of pregnancy, childbirth and the puerperium: Secondary | ICD-10-CM

## 2020-04-20 DIAGNOSIS — O0992 Supervision of high risk pregnancy, unspecified, second trimester: Secondary | ICD-10-CM

## 2020-04-20 DIAGNOSIS — E669 Obesity, unspecified: Secondary | ICD-10-CM

## 2020-04-20 DIAGNOSIS — O0990 Supervision of high risk pregnancy, unspecified, unspecified trimester: Secondary | ICD-10-CM | POA: Diagnosis not present

## 2020-04-20 DIAGNOSIS — Z8659 Personal history of other mental and behavioral disorders: Secondary | ICD-10-CM

## 2020-04-20 DIAGNOSIS — Z3A20 20 weeks gestation of pregnancy: Secondary | ICD-10-CM

## 2020-04-20 DIAGNOSIS — Z8279 Family history of other congenital malformations, deformations and chromosomal abnormalities: Secondary | ICD-10-CM | POA: Diagnosis not present

## 2020-04-20 DIAGNOSIS — J4520 Mild intermittent asthma, uncomplicated: Secondary | ICD-10-CM

## 2020-04-20 DIAGNOSIS — Z98891 History of uterine scar from previous surgery: Secondary | ICD-10-CM | POA: Insufficient documentation

## 2020-04-20 DIAGNOSIS — O99512 Diseases of the respiratory system complicating pregnancy, second trimester: Secondary | ICD-10-CM

## 2020-04-20 DIAGNOSIS — Z6791 Unspecified blood type, Rh negative: Secondary | ICD-10-CM

## 2020-04-20 MED ORDER — ALBUTEROL SULFATE HFA 108 (90 BASE) MCG/ACT IN AERS: 2.0000 | INHALATION_SPRAY | RESPIRATORY_TRACT | 2 refills | Status: AC | PRN

## 2020-04-20 NOTE — Progress Notes (Signed)
° °  PRENATAL VISIT NOTE  Subjective:  Mary Campos is a 34 y.o. G5P4004 at [redacted]w[redacted]d being seen today for ongoing prenatal care.  She is currently monitored for the following issues for this high-risk pregnancy and has Anxiety state; Asthma; History of gestational hypertension; Maternal obesity, antepartum; BMI 50.0-59.9, adult (HCC); History of postpartum depression; Cervical dysplasia; Supervision of high risk pregnancy, antepartum; Rh negative, antepartum; Family history of spina bifida; and Previous cesarean section on their problem list.  Patient reports no complaints. Seen in ER for concern for PNA. S/p abx for sinus infection.  Contractions: Not present. Vag. Bleeding: None.  Movement: Present. Denies leaking of fluid.   The following portions of the patient's history were reviewed and updated as appropriate: allergies, current medications, past family history, past medical history, past social history, past surgical history and problem list.   Objective:   Vitals:   04/20/20 1349  BP: 129/83  Pulse: (!) 102  Weight: 293 lb (132.9 kg)    Fetal Status: Fetal Heart Rate (bpm): 155   Movement: Present     General:  Alert, oriented and cooperative. Patient is in no acute distress.  Skin: Skin is warm and dry. No rash noted.   Cardiovascular: Normal heart rate noted  Respiratory: Normal respiratory effort, no problems with respiration noted  Abdomen: Soft, gravid, appropriate for gestational age.  Pain/Pressure: Present     Pelvic: Cervical exam deferred        Extremities: Normal range of motion.     Mental Status: Normal mood and affect. Normal behavior. Normal judgment and thought content.   Assessment and Plan:  Pregnancy: G5P4004 at [redacted]w[redacted]d  1. Family history of spina bifida On folic acid Extensive discussion about AFP- patient desires today.   2. Supervision of high risk pregnancy, antepartum Up to date  3. Rh negative, antepartum Rhogam at 28 wk  4. History of  postpartum depression Monitor mood  5. Maternal obesity, antepartum TWG=-1 lb (-0.454 kg) which is at goal  6. History of gestational hypertension BP WNL today  7. Previous cesarean section Plan for repeat at 39 wks  Preterm labor symptoms and general obstetric precautions including but not limited to vaginal bleeding, contractions, leaking of fluid and fetal movement were reviewed in detail with the patient. Please refer to After Visit Summary for other counseling recommendations.   Return in about 4 weeks (around 05/18/2020) for Routine prenatal care, Telehealth/Virtual health OB Visit or , in person.  Future Appointments  Date Time Provider Department Center  05/12/2020 10:45 AM [facility] NURSE [facility]  05/12/2020 10:45 AM [facility] US4 [facility]  05/17/2020  1:30 PM Constant, Gigi Gin, MD [facility]  06/15/2020  8:15 AM Federico Flake, MD [facility]    Federico Flake, MD

## 2020-04-22 LAB — AFP, SERUM, OPEN SPINA BIFIDA
AFP MoM: 1.7
AFP Value: 67.2 ng/mL
Gest. Age on Collection Date: 20 weeks
Maternal Age At EDD: 32.5 yr
OSBR Risk 1 IN: 1630
Test Results:: NEGATIVE
Weight: 293 [lb_av]

## 2020-05-12 ENCOUNTER — Ambulatory Visit: Payer: BC Managed Care – PPO | Admitting: *Deleted

## 2020-05-12 ENCOUNTER — Other Ambulatory Visit: Payer: Self-pay

## 2020-05-12 ENCOUNTER — Other Ambulatory Visit: Payer: Self-pay | Admitting: *Deleted

## 2020-05-12 ENCOUNTER — Ambulatory Visit: Payer: BC Managed Care – PPO

## 2020-05-12 ENCOUNTER — Ambulatory Visit: Payer: BC Managed Care – PPO | Attending: Family Medicine

## 2020-05-12 DIAGNOSIS — O09292 Supervision of pregnancy with other poor reproductive or obstetric history, second trimester: Secondary | ICD-10-CM

## 2020-05-12 DIAGNOSIS — Z8279 Family history of other congenital malformations, deformations and chromosomal abnormalities: Secondary | ICD-10-CM | POA: Diagnosis not present

## 2020-05-12 DIAGNOSIS — Z6791 Unspecified blood type, Rh negative: Secondary | ICD-10-CM

## 2020-05-12 DIAGNOSIS — Z362 Encounter for other antenatal screening follow-up: Secondary | ICD-10-CM | POA: Insufficient documentation

## 2020-05-12 DIAGNOSIS — O0990 Supervision of high risk pregnancy, unspecified, unspecified trimester: Secondary | ICD-10-CM | POA: Insufficient documentation

## 2020-05-12 DIAGNOSIS — O2692 Pregnancy related conditions, unspecified, second trimester: Secondary | ICD-10-CM | POA: Diagnosis not present

## 2020-05-12 DIAGNOSIS — Z98891 History of uterine scar from previous surgery: Secondary | ICD-10-CM | POA: Insufficient documentation

## 2020-05-12 DIAGNOSIS — Z3A23 23 weeks gestation of pregnancy: Secondary | ICD-10-CM

## 2020-05-12 DIAGNOSIS — O360120 Maternal care for anti-D [Rh] antibodies, second trimester, not applicable or unspecified: Secondary | ICD-10-CM | POA: Diagnosis not present

## 2020-05-12 DIAGNOSIS — O26899 Other specified pregnancy related conditions, unspecified trimester: Secondary | ICD-10-CM | POA: Insufficient documentation

## 2020-05-12 DIAGNOSIS — Z363 Encounter for antenatal screening for malformations: Secondary | ICD-10-CM | POA: Diagnosis not present

## 2020-05-12 DIAGNOSIS — O99212 Obesity complicating pregnancy, second trimester: Secondary | ICD-10-CM

## 2020-05-12 DIAGNOSIS — O99891 Other specified diseases and conditions complicating pregnancy: Secondary | ICD-10-CM

## 2020-05-12 DIAGNOSIS — E669 Obesity, unspecified: Secondary | ICD-10-CM

## 2020-05-12 DIAGNOSIS — J45909 Unspecified asthma, uncomplicated: Secondary | ICD-10-CM

## 2020-05-17 ENCOUNTER — Ambulatory Visit (INDEPENDENT_AMBULATORY_CARE_PROVIDER_SITE_OTHER): Payer: BC Managed Care – PPO | Admitting: Obstetrics and Gynecology

## 2020-05-17 ENCOUNTER — Encounter: Payer: Self-pay | Admitting: Obstetrics and Gynecology

## 2020-05-17 ENCOUNTER — Other Ambulatory Visit: Payer: Self-pay

## 2020-05-17 VITALS — BP 116/76 | HR 58 | Wt 294.2 lb

## 2020-05-17 DIAGNOSIS — O0992 Supervision of high risk pregnancy, unspecified, second trimester: Secondary | ICD-10-CM

## 2020-05-17 DIAGNOSIS — E669 Obesity, unspecified: Secondary | ICD-10-CM

## 2020-05-17 DIAGNOSIS — Z6791 Unspecified blood type, Rh negative: Secondary | ICD-10-CM

## 2020-05-17 DIAGNOSIS — Z98891 History of uterine scar from previous surgery: Secondary | ICD-10-CM

## 2020-05-17 DIAGNOSIS — O99210 Obesity complicating pregnancy, unspecified trimester: Secondary | ICD-10-CM

## 2020-05-17 DIAGNOSIS — Z8759 Personal history of other complications of pregnancy, childbirth and the puerperium: Secondary | ICD-10-CM

## 2020-05-17 DIAGNOSIS — O0990 Supervision of high risk pregnancy, unspecified, unspecified trimester: Secondary | ICD-10-CM

## 2020-05-17 DIAGNOSIS — Z3A24 24 weeks gestation of pregnancy: Secondary | ICD-10-CM

## 2020-05-17 DIAGNOSIS — O99212 Obesity complicating pregnancy, second trimester: Secondary | ICD-10-CM

## 2020-05-17 DIAGNOSIS — Z8279 Family history of other congenital malformations, deformations and chromosomal abnormalities: Secondary | ICD-10-CM

## 2020-05-17 NOTE — Progress Notes (Signed)
   PRENATAL VISIT NOTE  Subjective:  Mary Campos is a 34 y.o. G5P4004 at [redacted]w[redacted]d being seen today for ongoing prenatal care.  She is currently monitored for the following issues for this high-risk pregnancy and has Anxiety state; Asthma; History of gestational hypertension; Maternal obesity, antepartum; BMI 50.0-59.9, adult (HCC); History of postpartum depression; Cervical dysplasia; Supervision of high risk pregnancy, antepartum; Rh negative, antepartum; Family history of spina bifida; and Previous cesarean section on their problem list.  Patient reports no complaints.  Contractions: Not present.  .  Movement: Present. Denies leaking of fluid.   The following portions of the patient's history were reviewed and updated as appropriate: allergies, current medications, past family history, past medical history, past social history, past surgical history and problem list.   Objective:   Vitals:   05/17/20 1338  BP: 116/76  Pulse: 58  Weight: (!) 294 lb 3.2 oz (133.4 kg)    Fetal Status: Fetal Heart Rate (bpm): 150   Movement: Present     General:  Alert, oriented and cooperative. Patient is in no acute distress.  Skin: Skin is warm and dry. No rash noted.   Cardiovascular: Normal heart rate noted  Respiratory: Normal respiratory effort, no problems with respiration noted  Abdomen: Soft, gravid, appropriate for gestational age.  Pain/Pressure: Present     Pelvic: Cervical exam deferred        Extremities: Normal range of motion.     Mental Status: Normal mood and affect. Normal behavior. Normal judgment and thought content.   Assessment and Plan:  Pregnancy: G5P4004 at 106w2d 1. Supervision of high risk pregnancy, antepartum Patient doing well without complaints Third trimester lab with glucola next visit Follow up hear anatomy scheduled on 8/19  2. Previous cesarean section Will be scheduled for repeat at 39 weeks  3. Rh negative, antepartum Rhogam at 28 weeks  4. Maternal  obesity, antepartum Continue ASA Antenatal testing weekly starting at 34 weeks per MFM  5. History of gestational hypertension Normotensive today  6. History of postpartum depression Stable. Not on current medication  7. Family history of spina bifida Continue Folic acid Normal AFP and anatomy  Preterm labor symptoms and general obstetric precautions including but not limited to vaginal bleeding, contractions, leaking of fluid and fetal movement were reviewed in detail with the patient. Please refer to After Visit Summary for other counseling recommendations.   Return in about 4 weeks (around 06/14/2020) for in person, ROB, High risk, 2 hr glucola next visit.  Future Appointments  Date Time Provider Department Center  06/09/2020 11:00 AM [facility] NURSE [facility]  06/09/2020 11:15 AM [facility] US2 [facility]  06/15/2020  8:15 AM Alvester Morin, Isa Rankin, MD [facility]    Catalina Antigua, MD

## 2020-06-09 ENCOUNTER — Ambulatory Visit: Payer: BC Managed Care – PPO | Attending: Obstetrics and Gynecology

## 2020-06-09 ENCOUNTER — Encounter: Payer: Self-pay | Admitting: *Deleted

## 2020-06-09 ENCOUNTER — Ambulatory Visit: Payer: BC Managed Care – PPO | Admitting: *Deleted

## 2020-06-09 ENCOUNTER — Other Ambulatory Visit: Payer: Self-pay | Admitting: *Deleted

## 2020-06-09 ENCOUNTER — Other Ambulatory Visit: Payer: Self-pay

## 2020-06-09 DIAGNOSIS — O2692 Pregnancy related conditions, unspecified, second trimester: Secondary | ICD-10-CM

## 2020-06-09 DIAGNOSIS — Z98891 History of uterine scar from previous surgery: Secondary | ICD-10-CM

## 2020-06-09 DIAGNOSIS — Z8279 Family history of other congenital malformations, deformations and chromosomal abnormalities: Secondary | ICD-10-CM | POA: Diagnosis not present

## 2020-06-09 DIAGNOSIS — O0990 Supervision of high risk pregnancy, unspecified, unspecified trimester: Secondary | ICD-10-CM | POA: Insufficient documentation

## 2020-06-09 DIAGNOSIS — Z6791 Unspecified blood type, Rh negative: Secondary | ICD-10-CM | POA: Diagnosis not present

## 2020-06-09 DIAGNOSIS — Z362 Encounter for other antenatal screening follow-up: Secondary | ICD-10-CM | POA: Insufficient documentation

## 2020-06-09 DIAGNOSIS — O360120 Maternal care for anti-D [Rh] antibodies, second trimester, not applicable or unspecified: Secondary | ICD-10-CM | POA: Diagnosis not present

## 2020-06-09 DIAGNOSIS — Z363 Encounter for antenatal screening for malformations: Secondary | ICD-10-CM

## 2020-06-09 DIAGNOSIS — O34219 Maternal care for unspecified type scar from previous cesarean delivery: Secondary | ICD-10-CM | POA: Diagnosis not present

## 2020-06-09 DIAGNOSIS — O99212 Obesity complicating pregnancy, second trimester: Secondary | ICD-10-CM

## 2020-06-09 DIAGNOSIS — O09292 Supervision of pregnancy with other poor reproductive or obstetric history, second trimester: Secondary | ICD-10-CM

## 2020-06-09 DIAGNOSIS — J45909 Unspecified asthma, uncomplicated: Secondary | ICD-10-CM

## 2020-06-09 DIAGNOSIS — Z3A27 27 weeks gestation of pregnancy: Secondary | ICD-10-CM

## 2020-06-09 DIAGNOSIS — O26899 Other specified pregnancy related conditions, unspecified trimester: Secondary | ICD-10-CM | POA: Insufficient documentation

## 2020-06-09 DIAGNOSIS — E669 Obesity, unspecified: Secondary | ICD-10-CM

## 2020-06-09 DIAGNOSIS — O99891 Other specified diseases and conditions complicating pregnancy: Secondary | ICD-10-CM

## 2020-06-14 ENCOUNTER — Other Ambulatory Visit: Payer: Self-pay

## 2020-06-14 ENCOUNTER — Ambulatory Visit (INDEPENDENT_AMBULATORY_CARE_PROVIDER_SITE_OTHER): Payer: BC Managed Care – PPO | Admitting: Obstetrics and Gynecology

## 2020-06-14 VITALS — BP 135/83 | HR 92 | Wt 298.0 lb

## 2020-06-14 DIAGNOSIS — O26899 Other specified pregnancy related conditions, unspecified trimester: Secondary | ICD-10-CM

## 2020-06-14 DIAGNOSIS — Z6841 Body Mass Index (BMI) 40.0 and over, adult: Secondary | ICD-10-CM

## 2020-06-14 DIAGNOSIS — O0990 Supervision of high risk pregnancy, unspecified, unspecified trimester: Secondary | ICD-10-CM | POA: Diagnosis not present

## 2020-06-14 DIAGNOSIS — Z6791 Unspecified blood type, Rh negative: Secondary | ICD-10-CM

## 2020-06-14 DIAGNOSIS — Z23 Encounter for immunization: Secondary | ICD-10-CM

## 2020-06-14 DIAGNOSIS — Z3A28 28 weeks gestation of pregnancy: Secondary | ICD-10-CM | POA: Diagnosis not present

## 2020-06-14 DIAGNOSIS — Z98891 History of uterine scar from previous surgery: Secondary | ICD-10-CM

## 2020-06-14 DIAGNOSIS — O99210 Obesity complicating pregnancy, unspecified trimester: Secondary | ICD-10-CM

## 2020-06-14 DIAGNOSIS — Z8759 Personal history of other complications of pregnancy, childbirth and the puerperium: Secondary | ICD-10-CM

## 2020-06-14 NOTE — Progress Notes (Addendum)
Prenatal Visit Note Date: 06/14/2020 Clinic: [facility]-  Subjective:  Mary Campos is a 34 y.o. [id number] at [redacted]w[redacted]d being seen today for ongoing prenatal care.  She is currently monitored for the following issues for this high-risk pregnancy and has Anxiety state; Asthma; History of gestational hypertension; Maternal obesity, antepartum; BMI 50.0-59.9, adult (HCC); History of postpartum depression; Cervical dysplasia; Supervision of high risk pregnancy, antepartum; Family history of spina bifida; and Previous cesarean section on their problem list.  Patient reports no complaints.   Contractions: Not present. Vag. Bleeding: None.  Movement: Present. Denies leaking of fluid.   The following portions of the patient's history were reviewed and updated as appropriate: allergies, current medications, past family history, past medical history, past social history, past surgical history and problem list. Problem list updated.  Objective:   Vitals:   06/14/20 0820  BP: 135/83  Pulse: 92  Weight: 298 lb (135.2 kg)    Fetal Status: Fetal Heart Rate (bpm): 145   Movement: Present     General:  Alert, oriented and cooperative. Patient is in no acute distress.  Skin: Skin is warm and dry. No rash noted.   Cardiovascular: Normal heart rate noted  Respiratory: Normal respiratory effort, no problems with respiration noted  Abdomen: Soft, gravid, appropriate for gestational age. Pain/Pressure: Present     Pelvic:  Cervical exam deferred        Extremities: Normal range of motion.     Mental Status: Normal mood and affect. Normal behavior. Normal judgment and thought content.   Urinalysis:      Assessment and Plan:  Pregnancy: G5P4004 at [redacted]w[redacted]d  1. [redacted] weeks gestation of pregnancy - Glucose Tolerance, 2 Hours w/1 Hour - CBC - RPR - HIV Antibody (routine testing w rflx)  2. Supervision of high risk pregnancy, antepartum Routine care. Leaning towards BTL. D/w her re:  salpingectomy - Glucose Tolerance, 2 Hours w/1 Hour - CBC - RPR - HIV Antibody (routine testing w rflx)  3. BMI 50.0-59.9, adult (HCC) Minimal weight gain. Patient congratulated. Start qwk testing at 34wks per mfm  4. History of gestational hypertension Confirms low dose asa  5. Maternal obesity, antepartum  6. Previous cesarean section Will schedule for Monday at 39/1, 11/8  Preterm labor symptoms and general obstetric precautions including but not limited to vaginal bleeding, contractions, leaking of fluid and fetal movement were reviewed in detail with the patient. Please refer to After Visit Summary for other counseling recommendations.  Return in about 2 weeks (around 06/28/2020) for high risk, in person, with dr Vergie Living.   Weatherford Bing, MD

## 2020-06-15 ENCOUNTER — Encounter: Payer: BC Managed Care – PPO | Admitting: Family Medicine

## 2020-06-15 LAB — CBC
Hematocrit: 37.5 % (ref 34.0–46.6)
Hemoglobin: 12.2 g/dL (ref 11.1–15.9)
MCH: 30 pg (ref 26.6–33.0)
MCHC: 32.5 g/dL (ref 31.5–35.7)
MCV: 92 fL (ref 79–97)
Platelets: 238 10*3/uL (ref 150–450)
RBC: 4.06 x10E6/uL (ref 3.77–5.28)
RDW: 13.2 % (ref 11.7–15.4)
WBC: 8.6 10*3/uL (ref 3.4–10.8)

## 2020-06-15 LAB — HIV ANTIBODY (ROUTINE TESTING W REFLEX): HIV Screen 4th Generation wRfx: NONREACTIVE

## 2020-06-15 LAB — RPR: RPR Ser Ql: NONREACTIVE

## 2020-06-15 LAB — GLUCOSE TOLERANCE, 2 HOURS W/ 1HR
Glucose, 1 hour: 108 mg/dL (ref 65–179)
Glucose, 2 hour: 75 mg/dL (ref 65–152)
Glucose, Fasting: 82 mg/dL (ref 65–91)

## 2020-06-30 ENCOUNTER — Ambulatory Visit (INDEPENDENT_AMBULATORY_CARE_PROVIDER_SITE_OTHER): Payer: BC Managed Care – PPO | Admitting: Family Medicine

## 2020-06-30 ENCOUNTER — Other Ambulatory Visit: Payer: Self-pay

## 2020-06-30 ENCOUNTER — Encounter: Payer: Self-pay | Admitting: Family Medicine

## 2020-06-30 VITALS — BP 133/79 | HR 80 | Wt 296.8 lb

## 2020-06-30 DIAGNOSIS — O0990 Supervision of high risk pregnancy, unspecified, unspecified trimester: Secondary | ICD-10-CM

## 2020-06-30 DIAGNOSIS — Z98891 History of uterine scar from previous surgery: Secondary | ICD-10-CM

## 2020-06-30 LAB — POCT URINALYSIS DIPSTICK
Blood, UA: NEGATIVE
Leukocytes, UA: NEGATIVE

## 2020-06-30 NOTE — Progress Notes (Signed)
   PRENATAL VISIT NOTE  Subjective:  Mary Campos is a 34 y.o. G5P4004 at [redacted]w[redacted]d being seen today for ongoing prenatal care.  She is currently monitored for the following issues for this high-risk pregnancy and has Anxiety state; Asthma; History of gestational hypertension; Maternal obesity, antepartum; BMI 50.0-59.9, adult (HCC); History of postpartum depression; Cervical dysplasia; Supervision of high risk pregnancy, antepartum; Family history of spina bifida; and Previous cesarean section on their problem list.  Patient reports no complaints.  Contractions: Not present. Vag. Bleeding: None.  Movement: Present. Denies leaking of fluid.   The following portions of the patient's history were reviewed and updated as appropriate: allergies, current medications, past family history, past medical history, past social history, past surgical history and problem list.   Objective:   Vitals:   06/30/20 1108  BP: 133/79  Pulse: 80  Weight: 296 lb 12.8 oz (134.6 kg)    Fetal Status: Fetal Heart Rate (bpm): 146   Movement: Present     General:  Alert, oriented and cooperative. Patient is in no acute distress.  Skin: Skin is warm and dry. No rash noted.   Cardiovascular: Normal heart rate noted  Respiratory: Normal respiratory effort, no problems with respiration noted  Abdomen: Soft, gravid, appropriate for gestational age.  Pain/Pressure: Absent     Pelvic: Cervical exam deferred        Extremities: Normal range of motion.  Edema: Trace  Mental Status: Normal mood and affect. Normal behavior. Normal judgment and thought content.   Assessment and Plan:  Pregnancy: G5P4004 at [redacted]w[redacted]d 1. Supervision of high risk pregnancy, antepartum Continue routine prenatal care. Cultures next visit - POCT Urinalysis Dipstick  2. Previous cesarean section Already booked for #5.  Preterm labor symptoms and general obstetric precautions including but not limited to vaginal bleeding, contractions, leaking of  fluid and fetal movement were reviewed in detail with the patient. Please refer to After Visit Summary for other counseling recommendations.   Return in 2 weeks (on 07/14/2020).  Future Appointments  Date Time Provider Department Center  07/14/2020  9:45 AM Reva Bores, MD [facility]  07/14/2020 11:15 AM [facility] NURSE [facility]  07/14/2020 11:15 AM [facility] US2 [facility]  07/28/2020 11:00 AM [place] Bing, MD [facility]  08/11/2020 11:00 AM Reva Bores, MD [facility]  08/18/2020 11:00 AM Reva Bores, MD [facility]    Reva Bores, MD

## 2020-06-30 NOTE — Patient Instructions (Signed)

## 2020-07-14 ENCOUNTER — Ambulatory Visit: Payer: BC Managed Care – PPO | Attending: Obstetrics and Gynecology

## 2020-07-14 ENCOUNTER — Other Ambulatory Visit: Payer: Self-pay

## 2020-07-14 ENCOUNTER — Ambulatory Visit: Payer: BC Managed Care – PPO | Admitting: *Deleted

## 2020-07-14 ENCOUNTER — Other Ambulatory Visit: Payer: Self-pay | Admitting: *Deleted

## 2020-07-14 ENCOUNTER — Ambulatory Visit (INDEPENDENT_AMBULATORY_CARE_PROVIDER_SITE_OTHER): Payer: BC Managed Care – PPO | Admitting: Family Medicine

## 2020-07-14 VITALS — BP 139/77 | HR 83 | Wt 297.8 lb

## 2020-07-14 DIAGNOSIS — Z98891 History of uterine scar from previous surgery: Secondary | ICD-10-CM | POA: Insufficient documentation

## 2020-07-14 DIAGNOSIS — Z8279 Family history of other congenital malformations, deformations and chromosomal abnormalities: Secondary | ICD-10-CM | POA: Diagnosis not present

## 2020-07-14 DIAGNOSIS — E669 Obesity, unspecified: Secondary | ICD-10-CM

## 2020-07-14 DIAGNOSIS — O0990 Supervision of high risk pregnancy, unspecified, unspecified trimester: Secondary | ICD-10-CM | POA: Insufficient documentation

## 2020-07-14 DIAGNOSIS — O09293 Supervision of pregnancy with other poor reproductive or obstetric history, third trimester: Secondary | ICD-10-CM | POA: Diagnosis not present

## 2020-07-14 DIAGNOSIS — J45909 Unspecified asthma, uncomplicated: Secondary | ICD-10-CM

## 2020-07-14 DIAGNOSIS — O99213 Obesity complicating pregnancy, third trimester: Secondary | ICD-10-CM | POA: Diagnosis not present

## 2020-07-14 DIAGNOSIS — Z362 Encounter for other antenatal screening follow-up: Secondary | ICD-10-CM | POA: Insufficient documentation

## 2020-07-14 DIAGNOSIS — O99513 Diseases of the respiratory system complicating pregnancy, third trimester: Secondary | ICD-10-CM

## 2020-07-14 DIAGNOSIS — Z3A32 32 weeks gestation of pregnancy: Secondary | ICD-10-CM

## 2020-07-14 DIAGNOSIS — N879 Dysplasia of cervix uteri, unspecified: Secondary | ICD-10-CM

## 2020-07-14 DIAGNOSIS — O34219 Maternal care for unspecified type scar from previous cesarean delivery: Secondary | ICD-10-CM

## 2020-07-14 DIAGNOSIS — O2693 Pregnancy related conditions, unspecified, third trimester: Secondary | ICD-10-CM

## 2020-07-14 DIAGNOSIS — Z6791 Unspecified blood type, Rh negative: Secondary | ICD-10-CM

## 2020-07-14 DIAGNOSIS — O360190 Maternal care for anti-D [Rh] antibodies, unspecified trimester, not applicable or unspecified: Secondary | ICD-10-CM

## 2020-07-14 NOTE — Patient Instructions (Signed)

## 2020-07-14 NOTE — Progress Notes (Signed)
   PRENATAL VISIT NOTE  Subjective:  Mary Campos is a 34 y.o. G5P4004 at [redacted]w[redacted]d being seen today for ongoing prenatal care.  She is currently monitored for the following issues for this high-risk pregnancy and has Anxiety state; Asthma; History of gestational hypertension; Maternal obesity, antepartum; BMI 50.0-59.9, adult (HCC); History of postpartum depression; Cervical dysplasia; Supervision of high risk pregnancy, antepartum; Family history of spina bifida; and Previous cesarean section on their problem list.  Patient reports no complaints.  Contractions: Not present. Vag. Bleeding: None.  Movement: Present. Denies leaking of fluid.   The following portions of the patient's history were reviewed and updated as appropriate: allergies, current medications, past family history, past medical history, past social history, past surgical history and problem list.   Objective:   Vitals:   07/14/20 1011  BP: 139/77  Pulse: 83  Weight: 297 lb 12.8 oz (135.1 kg)    Fetal Status: Fetal Heart Rate (bpm): 136   Movement: Present     General:  Alert, oriented and cooperative. Patient is in no acute distress.  Skin: Skin is warm and dry. No rash noted.   Cardiovascular: Normal heart rate noted  Respiratory: Normal respiratory effort, no problems with respiration noted  Abdomen: Soft, gravid, appropriate for gestational age.  Pain/Pressure: Present     Pelvic: Cervical exam deferred        Extremities: Normal range of motion.  Edema: Trace  Mental Status: Normal mood and affect. Normal behavior. Normal judgment and thought content.   Assessment and Plan:  Pregnancy: G5P4004 at [redacted]w[redacted]d 1. Previous cesarean section Booked for #5  2. Supervision of high risk pregnancy, antepartum Has a lot of urinary symtpoms. Having to get up 6x/night. Poor sleep as a result--check culture. - Culture, OB Urine  Preterm labor symptoms and general obstetric precautions including but not limited to vaginal  bleeding, contractions, leaking of fluid and fetal movement were reviewed in detail with the patient. Please refer to After Visit Summary for other counseling recommendations.   Return in 2 weeks (on 07/28/2020).  Future Appointments  Date Time Provider Department Center  07/14/2020 11:15 AM [facility] NURSE [facility]  07/14/2020 11:15 AM [facility] US2 [facility]  07/28/2020 11:00 AM [place] Bing, MD [facility]  08/11/2020 11:00 AM Reva Bores, MD [facility]  08/18/2020 11:00 AM Reva Bores, MD [facility]    Reva Bores, MD

## 2020-07-16 LAB — URINE CULTURE, OB REFLEX

## 2020-07-16 LAB — CULTURE, OB URINE

## 2020-07-26 ENCOUNTER — Other Ambulatory Visit: Payer: Self-pay | Admitting: Obstetrics and Gynecology

## 2020-07-28 ENCOUNTER — Ambulatory Visit (INDEPENDENT_AMBULATORY_CARE_PROVIDER_SITE_OTHER): Payer: BC Managed Care – PPO | Admitting: Obstetrics and Gynecology

## 2020-07-28 ENCOUNTER — Other Ambulatory Visit: Payer: Self-pay

## 2020-07-28 VITALS — BP 129/80 | HR 86 | Wt 297.4 lb

## 2020-07-28 DIAGNOSIS — O0990 Supervision of high risk pregnancy, unspecified, unspecified trimester: Secondary | ICD-10-CM

## 2020-07-28 DIAGNOSIS — Z98891 History of uterine scar from previous surgery: Secondary | ICD-10-CM

## 2020-07-28 DIAGNOSIS — Z3A34 34 weeks gestation of pregnancy: Secondary | ICD-10-CM

## 2020-07-28 DIAGNOSIS — Z6841 Body Mass Index (BMI) 40.0 and over, adult: Secondary | ICD-10-CM

## 2020-07-28 DIAGNOSIS — O99210 Obesity complicating pregnancy, unspecified trimester: Secondary | ICD-10-CM

## 2020-07-28 MED ORDER — MONTELUKAST SODIUM 10 MG PO TABS: 10.0000 mg | ORAL_TABLET | Freq: Every evening | ORAL | 0 refills | Status: DC | PRN

## 2020-07-28 NOTE — Progress Notes (Signed)
Prenatal Visit Note Date: 07/28/2020 Clinic: [facility]  Subjective:  Mary Campos is a 34 y.o. [id number] at 109w4d being seen today for ongoing prenatal care.  She is currently monitored for the following issues for this high-risk pregnancy and has Anxiety state; Asthma; History of gestational hypertension; Maternal obesity, antepartum; BMI 50.0-59.9, adult (HCC); History of postpartum depression; Cervical dysplasia; Supervision of high risk pregnancy, antepartum; Family history of spina bifida; and Previous cesarean section on their problem list.  Patient reports no complaints.   Contractions: Irritability. Vag. Bleeding: None.  Movement: Present. Denies leaking of fluid.   The following portions of the patient's history were reviewed and updated as appropriate: allergies, current medications, past family history, past medical history, past social history, past surgical history and problem list. Problem list updated.  Objective:   Vitals:   07/28/20 1051  BP: 129/80  Pulse: 86  Weight: 297 lb 6.4 oz (134.9 kg)    Fetal Status: Fetal Heart Rate (bpm): 139   Movement: Present     General:  Alert, oriented and cooperative. Patient is in no acute distress.  Skin: Skin is warm and dry. No rash noted.   Cardiovascular: Normal heart rate noted  Respiratory: Normal respiratory effort, no problems with respiration noted  Abdomen: Soft, gravid, appropriate for gestational age. Pain/Pressure: Present     Pelvic:  Cervical exam deferred        Extremities: Normal range of motion.  Edema: Trace  Mental Status: Normal mood and affect. Normal behavior. Normal judgment and thought content.   Urinalysis:      Assessment and Plan:  Pregnancy: G5P4004 at [redacted]w[redacted]d  1. Previous cesarean section Scheduled for rpt at 39wks  2. Supervision of high risk pregnancy, antepartum gbs nv - [place] MFM FETAL BPP WO NON STRESS; Future  3. BMI 50.0-59.9, adult (HCC) mfm recommdns qwk testing  starting around 34wks. Has rpt growth on 10/21 - [place] MFM FETAL BPP WO NON STRESS; Future  4. Maternal obesity, antepartum - [place] MFM FETAL BPP WO NON STRESS; Future  Preterm labor symptoms and general obstetric precautions including but not limited to vaginal bleeding, contractions, leaking of fluid and fetal movement were reviewed in detail with the patient. Please refer to After Visit Summary for other counseling recommendations.  Return in about 2 weeks (around 08/11/2020) for high risk, in person.   [place] Bing, MD

## 2020-08-11 ENCOUNTER — Other Ambulatory Visit: Payer: Self-pay

## 2020-08-11 ENCOUNTER — Ambulatory Visit (HOSPITAL_BASED_OUTPATIENT_CLINIC_OR_DEPARTMENT_OTHER): Payer: BC Managed Care – PPO

## 2020-08-11 ENCOUNTER — Ambulatory Visit (INDEPENDENT_AMBULATORY_CARE_PROVIDER_SITE_OTHER): Payer: BC Managed Care – PPO | Admitting: Family Medicine

## 2020-08-11 ENCOUNTER — Other Ambulatory Visit (HOSPITAL_COMMUNITY)
Admission: RE | Admit: 2020-08-11 | Discharge: 2020-08-11 | Disposition: A | Discharge status: Home / Self Care | Payer: BC Managed Care – PPO | Source: Ambulatory Visit | Attending: Family Medicine | Admitting: Family Medicine

## 2020-08-11 ENCOUNTER — Encounter: Payer: Self-pay | Admitting: *Deleted

## 2020-08-11 ENCOUNTER — Other Ambulatory Visit: Payer: Self-pay | Admitting: *Deleted

## 2020-08-11 ENCOUNTER — Encounter: Payer: BC Managed Care – PPO | Admitting: Family Medicine

## 2020-08-11 ENCOUNTER — Ambulatory Visit: Payer: BC Managed Care – PPO | Admitting: *Deleted

## 2020-08-11 VITALS — BP 121/84 | HR 83 | Wt 260.6 lb

## 2020-08-11 DIAGNOSIS — O99213 Obesity complicating pregnancy, third trimester: Secondary | ICD-10-CM | POA: Diagnosis not present

## 2020-08-11 DIAGNOSIS — O0993 Supervision of high risk pregnancy, unspecified, third trimester: Secondary | ICD-10-CM

## 2020-08-11 DIAGNOSIS — Z6791 Unspecified blood type, Rh negative: Secondary | ICD-10-CM

## 2020-08-11 DIAGNOSIS — Z362 Encounter for other antenatal screening follow-up: Secondary | ICD-10-CM

## 2020-08-11 DIAGNOSIS — Z98891 History of uterine scar from previous surgery: Secondary | ICD-10-CM

## 2020-08-11 DIAGNOSIS — O360190 Maternal care for anti-D [Rh] antibodies, unspecified trimester, not applicable or unspecified: Secondary | ICD-10-CM

## 2020-08-11 DIAGNOSIS — O09293 Supervision of pregnancy with other poor reproductive or obstetric history, third trimester: Secondary | ICD-10-CM

## 2020-08-11 DIAGNOSIS — O99513 Diseases of the respiratory system complicating pregnancy, third trimester: Secondary | ICD-10-CM | POA: Diagnosis not present

## 2020-08-11 DIAGNOSIS — O0990 Supervision of high risk pregnancy, unspecified, unspecified trimester: Secondary | ICD-10-CM

## 2020-08-11 DIAGNOSIS — O99210 Obesity complicating pregnancy, unspecified trimester: Secondary | ICD-10-CM | POA: Insufficient documentation

## 2020-08-11 DIAGNOSIS — O34219 Maternal care for unspecified type scar from previous cesarean delivery: Secondary | ICD-10-CM

## 2020-08-11 DIAGNOSIS — Z3A36 36 weeks gestation of pregnancy: Secondary | ICD-10-CM

## 2020-08-11 DIAGNOSIS — J45909 Unspecified asthma, uncomplicated: Secondary | ICD-10-CM

## 2020-08-11 DIAGNOSIS — Z8279 Family history of other congenital malformations, deformations and chromosomal abnormalities: Secondary | ICD-10-CM

## 2020-08-11 DIAGNOSIS — Z6841 Body Mass Index (BMI) 40.0 and over, adult: Secondary | ICD-10-CM | POA: Insufficient documentation

## 2020-08-11 DIAGNOSIS — E669 Obesity, unspecified: Secondary | ICD-10-CM

## 2020-08-11 NOTE — Patient Instructions (Signed)

## 2020-08-12 NOTE — Progress Notes (Signed)
   PRENATAL VISIT NOTE  Subjective:  Mary Campos is a 34 y.o. G5P4004 at [redacted]w[redacted]d being seen today for ongoing prenatal care.  She is currently monitored for the following issues for this high-risk pregnancy and has Anxiety state; Asthma; History of gestational hypertension; Maternal obesity, antepartum; BMI 50.0-59.9, adult (HCC); History of postpartum depression; Cervical dysplasia; Supervision of high risk pregnancy, antepartum; Family history of spina bifida; and Previous cesarean section on their problem list.  Patient reports fatigue.  Contractions: Irritability. Vag. Bleeding: None.  Movement: Present. Denies leaking of fluid.   The following portions of the patient's history were reviewed and updated as appropriate: allergies, current medications, past family history, past medical history, past social history, past surgical history and problem list.   Objective:   Vitals:   08/11/20 1133  BP: 121/84  Pulse: 83  Weight: 260 lb 9.6 oz (118.2 kg)    Fetal Status: Fetal Heart Rate (bpm): 143 Fundal Height: 36 cm Movement: Present     General:  Alert, oriented and cooperative. Patient is in no acute distress.  Skin: Skin is warm and dry. No rash noted.   Cardiovascular: Normal heart rate noted  Respiratory: Normal respiratory effort, no problems with respiration noted  Abdomen: Soft, gravid, appropriate for gestational age.  Pain/Pressure: Present     Pelvic: Cervical exam deferred        Extremities: Normal range of motion.  Edema: Trace  Mental Status: Normal mood and affect. Normal behavior. Normal judgment and thought content.   Assessment and Plan:  Pregnancy: G5P4004 at [redacted]w[redacted]d 1. Previous cesarean section X4, for repeat C-section and BTL which has been scheduled.  2. Supervision of high risk pregnancy, antepartum Cultures today - Strep Gp B NAA - GC/Chlamydia probe amp ([place])not at [facility] And antenatal testing due to BMI.  Preterm labor symptoms and general  obstetric precautions including but not limited to vaginal bleeding, contractions, leaking of fluid and fetal movement were reviewed in detail with the patient. Please refer to After Visit Summary for other counseling recommendations.   No follow-ups on file.  Future Appointments  Date Time Provider Department Center  08/18/2020 11:00 AM Reva Bores, MD [facility]  08/19/2020  1:00 PM [facility] NURSE [facility]  08/19/2020  1:15 PM [facility] NST [facility]  08/23/2020 11:15 AM  Bing, MD [facility]  08/25/2020 12:45 PM [facility] NURSE [facility]  08/25/2020  1:00 PM [facility] US1 [facility]  08/26/2020  9:30 AM MC-SCREENING [facility]-SDSC None  08/26/2020 10:30 AM MC-LD PAT 1 MC-INDC None    Reva Bores, MD

## 2020-08-13 LAB — STREP GP B NAA: Strep Gp B NAA: NEGATIVE

## 2020-08-15 ENCOUNTER — Telehealth (HOSPITAL_COMMUNITY): Payer: Self-pay | Admitting: *Deleted

## 2020-08-15 LAB — GC/CHLAMYDIA PROBE AMP (~~LOC~~) NOT AT ARMC
Chlamydia: NEGATIVE
Comment: NEGATIVE
Comment: NORMAL
Neisseria Gonorrhea: NEGATIVE

## 2020-08-15 NOTE — Patient Instructions (Signed)
Mary Campos  08/15/2020   Your procedure is scheduled on:  08/29/2020  Arrive at 1030 at Entrance C on [address] at [facility]  and [employer]. You are invited to use the FREE valet parking or use the Visitor's parking deck.  Pick up the phone at the desk and dial [phone].  Call this number if you have problems the morning of surgery: [phone]  Remember:   Do not eat food:(After Midnight) Desps de medianoche.  Do not drink clear liquids: (After Midnight) Desps de medianoche.  Take these medicines the morning of surgery with A SIP OF WATER:  BRING YOUR INHALER WITH YOU   Do not wear jewelry, make-up or nail polish.  Do not wear lotions, powders, or perfumes. Do not wear deodorant.  Do not shave 48 hours prior to surgery.  Do not bring valuables to the hospital.  [facility] is not   responsible for any belongings or valuables brought to the hospital.  Contacts, dentures or bridgework may not be worn into surgery.  Leave suitcase in the car. After surgery it may be brought to your room.  For patients admitted to the hospital, checkout time is 11:00 AM the day of              discharge.      Please read over the following fact sheets that you were given:     Preparing for Surgery

## 2020-08-15 NOTE — Telephone Encounter (Signed)
Preadmission screen  

## 2020-08-16 ENCOUNTER — Telehealth (HOSPITAL_COMMUNITY): Payer: Self-pay | Admitting: *Deleted

## 2020-08-16 ENCOUNTER — Encounter (HOSPITAL_COMMUNITY): Payer: Self-pay

## 2020-08-16 NOTE — Telephone Encounter (Signed)
Preadmission screen  

## 2020-08-18 ENCOUNTER — Other Ambulatory Visit: Payer: Self-pay

## 2020-08-18 ENCOUNTER — Ambulatory Visit (INDEPENDENT_AMBULATORY_CARE_PROVIDER_SITE_OTHER): Payer: BC Managed Care – PPO | Admitting: Family Medicine

## 2020-08-18 VITALS — BP 128/74 | HR 86 | Wt 299.0 lb

## 2020-08-18 DIAGNOSIS — O0990 Supervision of high risk pregnancy, unspecified, unspecified trimester: Secondary | ICD-10-CM

## 2020-08-18 DIAGNOSIS — Z98891 History of uterine scar from previous surgery: Secondary | ICD-10-CM

## 2020-08-18 NOTE — Progress Notes (Signed)
   PRENATAL VISIT NOTE  Subjective:  Rufina Kimery is a 34 y.o. G5P4004 at [redacted]w[redacted]d being seen today for ongoing prenatal care.  She is currently monitored for the following issues for this high-risk pregnancy and has Anxiety state; Asthma; History of gestational hypertension; Maternal obesity, antepartum; BMI 50.0-59.9, adult (HCC); History of postpartum depression; Cervical dysplasia; Supervision of high risk pregnancy, antepartum; Family history of spina bifida; and Previous cesarean section on their problem list.  Patient reports no complaints.  Contractions: Irregular. Vag. Bleeding: None.  Movement: Present. Denies leaking of fluid.   The following portions of the patient's history were reviewed and updated as appropriate: allergies, current medications, past family history, past medical history, past social history, past surgical history and problem list.   Objective:   Vitals:   08/18/20 1102  BP: 128/74  Pulse: 86  Weight: 299 lb (135.6 kg)    Fetal Status: Fetal Heart Rate (bpm): 152   Movement: Present     General:  Alert, oriented and cooperative. Patient is in no acute distress.  Skin: Skin is warm and dry. No rash noted.   Cardiovascular: Normal heart rate noted  Respiratory: Normal respiratory effort, no problems with respiration noted  Abdomen: Soft, gravid, appropriate for gestational age.  Pain/Pressure: Absent     Pelvic: Cervical exam deferred        Extremities: Normal range of motion.  Edema: Trace  Mental Status: Normal mood and affect. Normal behavior. Normal judgment and thought content.   Assessment and Plan:  Pregnancy: G5P4004 at [redacted]w[redacted]d 1. Supervision of high risk pregnancy, antepartum Continue prenatal care.   2. Previous cesarean section For RCS scheduled  Term labor symptoms and general obstetric precautions including but not limited to vaginal bleeding, contractions, leaking of fluid and fetal movement were reviewed in detail with the  patient. Please refer to After Visit Summary for other counseling recommendations.   Return in 1 week (on 08/25/2020).  Future Appointments  Date Time Provider Department Center  08/19/2020  1:00 PM [facility] NURSE [facility]  08/19/2020  1:15 PM [facility] NST [facility]  08/23/2020 11:15 AM [place] Bing, MD [facility]  08/25/2020 12:45 PM [facility] NURSE [facility]  08/25/2020  1:00 PM [facility] US1 [facility]  08/26/2020  9:30 AM MC-SCREENING MC-SDSC None  08/26/2020 10:30 AM MC-LD PAT 1 MC-INDC None    Reva Bores, MD

## 2020-08-18 NOTE — Patient Instructions (Signed)

## 2020-08-19 ENCOUNTER — Ambulatory Visit: Payer: BC Managed Care – PPO | Admitting: *Deleted

## 2020-08-19 ENCOUNTER — Ambulatory Visit: Payer: BC Managed Care – PPO | Attending: Maternal & Fetal Medicine | Admitting: *Deleted

## 2020-08-19 ENCOUNTER — Encounter: Payer: Self-pay | Admitting: *Deleted

## 2020-08-19 ENCOUNTER — Other Ambulatory Visit: Payer: Self-pay

## 2020-08-19 DIAGNOSIS — O0990 Supervision of high risk pregnancy, unspecified, unspecified trimester: Secondary | ICD-10-CM

## 2020-08-19 DIAGNOSIS — Z8279 Family history of other congenital malformations, deformations and chromosomal abnormalities: Secondary | ICD-10-CM | POA: Diagnosis not present

## 2020-08-19 DIAGNOSIS — Z3A00 Weeks of gestation of pregnancy not specified: Secondary | ICD-10-CM | POA: Insufficient documentation

## 2020-08-19 DIAGNOSIS — O99210 Obesity complicating pregnancy, unspecified trimester: Secondary | ICD-10-CM | POA: Diagnosis not present

## 2020-08-19 DIAGNOSIS — Z98891 History of uterine scar from previous surgery: Secondary | ICD-10-CM

## 2020-08-19 NOTE — Procedures (Signed)
Mary Campos 1986-10-13 [redacted]w[redacted]d  Fetus A Non-Stress Test Interpretation for 08/19/20  Indication: obesity  Fetal Heart Rate A Mode: External Baseline Rate (A): 135 bpm Variability: Moderate Accelerations: 15 x 15 Decelerations: None Multiple birth?: No  Uterine Activity Mode: Toco Contraction Frequency (min): none Resting Tone Palpated: Relaxed Resting Time: Adequate  Interpretation (Fetal Testing) Nonstress Test Interpretation: Reactive Overall Impression: Reassuring for gestational age

## 2020-08-23 ENCOUNTER — Ambulatory Visit (INDEPENDENT_AMBULATORY_CARE_PROVIDER_SITE_OTHER): Payer: BC Managed Care – PPO | Admitting: Obstetrics and Gynecology

## 2020-08-23 ENCOUNTER — Other Ambulatory Visit: Payer: Self-pay

## 2020-08-23 VITALS — BP 123/74 | HR 84 | Wt 299.0 lb

## 2020-08-23 DIAGNOSIS — Z3A38 38 weeks gestation of pregnancy: Secondary | ICD-10-CM

## 2020-08-23 DIAGNOSIS — Z98891 History of uterine scar from previous surgery: Secondary | ICD-10-CM

## 2020-08-23 NOTE — Progress Notes (Signed)
Prenatal Visit Note Date: 08/23/2020 Clinic: [facility]  Subjective:  Mary Campos is a 34 y.o. S3M1962 at [redacted]w[redacted]d being seen today for ongoing prenatal care.  She is currently monitored for the following issues for this high-risk pregnancy and has Anxiety state; Asthma; History of gestational hypertension; Maternal obesity, antepartum; BMI 50.0-59.9, adult (HCC); History of postpartum depression; Cervical dysplasia; Supervision of high risk pregnancy, antepartum; Family history of spina bifida; and Previous cesarean section on their problem list.  Patient reports no complaints.   Contractions: Not present. Vag. Bleeding: None.  Movement: Present. Denies leaking of fluid.   The following portions of the patient's history were reviewed and updated as appropriate: allergies, current medications, past family history, past medical history, past social history, past surgical history and problem list. Problem list updated.  Objective:   Vitals:   08/23/20 1117  BP: 123/74  Pulse: 84  Weight: 299 lb (135.6 kg)    Fetal Status: Fetal Heart Rate (bpm): 148   Movement: Present     General:  Alert, oriented and cooperative. Patient is in no acute distress.  Skin: Skin is warm and dry. No rash noted.   Cardiovascular: Normal heart rate noted  Respiratory: Normal respiratory effort, no problems with respiration noted  Abdomen: Soft, gravid, appropriate for gestational age. Pain/Pressure: Present     Pelvic:  Cervical exam deferred        Extremities: Normal range of motion.  Edema: Trace  Mental Status: Normal mood and affect. Normal behavior. Normal judgment and thought content.   Urinalysis:      Assessment and Plan:  Pregnancy: G5P4004 at [redacted]w[redacted]d  1. [redacted] weeks gestation of pregnancy F/u ap testing on Thursday for bmi  2. Previous cesarean section Rpt and BTL for Monday.   Term labor symptoms and general obstetric precautions including but not limited to vaginal  bleeding, contractions, leaking of fluid and fetal movement were reviewed in detail with the patient. Please refer to After Visit Summary for other counseling recommendations.  Return in about 13 days (around 09/05/2020). for incision check    [place] Bing, MD

## 2020-08-25 ENCOUNTER — Other Ambulatory Visit: Payer: Self-pay

## 2020-08-25 ENCOUNTER — Ambulatory Visit: Payer: BC Managed Care – PPO | Admitting: *Deleted

## 2020-08-25 ENCOUNTER — Encounter: Payer: Self-pay | Admitting: *Deleted

## 2020-08-25 ENCOUNTER — Ambulatory Visit: Payer: BC Managed Care – PPO | Attending: Maternal & Fetal Medicine

## 2020-08-25 DIAGNOSIS — O99213 Obesity complicating pregnancy, third trimester: Secondary | ICD-10-CM

## 2020-08-25 DIAGNOSIS — O360130 Maternal care for anti-D [Rh] antibodies, third trimester, not applicable or unspecified: Secondary | ICD-10-CM | POA: Diagnosis not present

## 2020-08-25 DIAGNOSIS — Z3A38 38 weeks gestation of pregnancy: Secondary | ICD-10-CM

## 2020-08-25 DIAGNOSIS — O0990 Supervision of high risk pregnancy, unspecified, unspecified trimester: Secondary | ICD-10-CM | POA: Diagnosis not present

## 2020-08-25 DIAGNOSIS — E669 Obesity, unspecified: Secondary | ICD-10-CM

## 2020-08-25 DIAGNOSIS — O2693 Pregnancy related conditions, unspecified, third trimester: Secondary | ICD-10-CM | POA: Diagnosis not present

## 2020-08-25 DIAGNOSIS — Z362 Encounter for other antenatal screening follow-up: Secondary | ICD-10-CM

## 2020-08-25 DIAGNOSIS — Z98891 History of uterine scar from previous surgery: Secondary | ICD-10-CM

## 2020-08-25 DIAGNOSIS — Z8279 Family history of other congenital malformations, deformations and chromosomal abnormalities: Secondary | ICD-10-CM

## 2020-08-25 DIAGNOSIS — O34219 Maternal care for unspecified type scar from previous cesarean delivery: Secondary | ICD-10-CM | POA: Diagnosis not present

## 2020-08-25 DIAGNOSIS — O99513 Diseases of the respiratory system complicating pregnancy, third trimester: Secondary | ICD-10-CM

## 2020-08-25 DIAGNOSIS — N870 Mild cervical dysplasia: Secondary | ICD-10-CM

## 2020-08-25 DIAGNOSIS — J45909 Unspecified asthma, uncomplicated: Secondary | ICD-10-CM

## 2020-08-26 ENCOUNTER — Encounter (HOSPITAL_COMMUNITY)
Admission: RE | Admit: 2020-08-26 | Discharge: 2020-08-26 | Disposition: A | Discharge status: Home / Self Care | Payer: BC Managed Care – PPO | Source: Ambulatory Visit | Attending: Obstetrics and Gynecology | Admitting: Obstetrics and Gynecology

## 2020-08-26 ENCOUNTER — Other Ambulatory Visit (HOSPITAL_COMMUNITY)
Admission: RE | Admit: 2020-08-26 | Discharge: 2020-08-26 | Disposition: A | Discharge status: Home / Self Care | Payer: BC Managed Care – PPO | Source: Ambulatory Visit | Attending: Obstetrics and Gynecology | Admitting: Obstetrics and Gynecology

## 2020-08-26 DIAGNOSIS — E669 Obesity, unspecified: Secondary | ICD-10-CM | POA: Diagnosis not present

## 2020-08-26 DIAGNOSIS — Z9851 Tubal ligation status: Secondary | ICD-10-CM | POA: Diagnosis not present

## 2020-08-26 DIAGNOSIS — Z01812 Encounter for preprocedural laboratory examination: Secondary | ICD-10-CM | POA: Insufficient documentation

## 2020-08-26 DIAGNOSIS — J45909 Unspecified asthma, uncomplicated: Secondary | ICD-10-CM | POA: Diagnosis not present

## 2020-08-26 DIAGNOSIS — O9952 Diseases of the respiratory system complicating childbirth: Secondary | ICD-10-CM | POA: Diagnosis not present

## 2020-08-26 DIAGNOSIS — Z20822 Contact with and (suspected) exposure to covid-19: Secondary | ICD-10-CM | POA: Insufficient documentation

## 2020-08-26 DIAGNOSIS — O99214 Obesity complicating childbirth: Secondary | ICD-10-CM | POA: Diagnosis not present

## 2020-08-26 DIAGNOSIS — O34211 Maternal care for low transverse scar from previous cesarean delivery: Secondary | ICD-10-CM | POA: Diagnosis not present

## 2020-08-26 DIAGNOSIS — Z302 Encounter for sterilization: Secondary | ICD-10-CM | POA: Diagnosis not present

## 2020-08-26 DIAGNOSIS — Z98891 History of uterine scar from previous surgery: Secondary | ICD-10-CM | POA: Diagnosis not present

## 2020-08-26 DIAGNOSIS — Z3A39 39 weeks gestation of pregnancy: Secondary | ICD-10-CM | POA: Diagnosis not present

## 2020-08-26 HISTORY — DX: Personal history of other diseases of the digestive system: Z87.19

## 2020-08-26 HISTORY — DX: Unspecified abnormal cytological findings in specimens from vagina: R87.629

## 2020-08-26 LAB — CBC
HCT: 36.7 % (ref 36.0–46.0)
Hemoglobin: 12.8 g/dL (ref 12.0–15.0)
MCH: 31.4 pg (ref 26.0–34.0)
MCHC: 34.9 g/dL (ref 30.0–36.0)
MCV: 90 fL (ref 80.0–100.0)
Platelets: 246 10*3/uL (ref 150–400)
RBC: 4.08 MIL/uL (ref 3.87–5.11)
RDW: 14.4 % (ref 11.5–15.5)
WBC: 7.1 10*3/uL (ref 4.0–10.5)
nRBC: 0 % (ref 0.0–0.2)

## 2020-08-26 LAB — TYPE AND SCREEN
ABO/RH(D): A POS
Antibody Screen: NEGATIVE

## 2020-08-26 LAB — SARS CORONAVIRUS 2 (TAT 6-24 HRS): SARS Coronavirus 2: NEGATIVE

## 2020-08-27 LAB — RPR: RPR Ser Ql: NONREACTIVE

## 2020-08-29 ENCOUNTER — Inpatient Hospital Stay (HOSPITAL_COMMUNITY)
Admission: RE | Admit: 2020-08-29 | Discharge: 2020-08-31 | DRG: 785 | Disposition: A | Discharge status: Home / Self Care | Payer: BC Managed Care – PPO | Attending: Obstetrics and Gynecology | Admitting: Obstetrics and Gynecology

## 2020-08-29 ENCOUNTER — Inpatient Hospital Stay (HOSPITAL_COMMUNITY): Payer: BC Managed Care – PPO | Admitting: Anesthesiology

## 2020-08-29 ENCOUNTER — Encounter (HOSPITAL_COMMUNITY)
Admission: RE | Disposition: A | Discharge status: Home / Self Care | Payer: Self-pay | Source: Home / Self Care | Attending: Obstetrics and Gynecology

## 2020-08-29 ENCOUNTER — Encounter (HOSPITAL_COMMUNITY): Payer: Self-pay | Admitting: Obstetrics and Gynecology

## 2020-08-29 ENCOUNTER — Other Ambulatory Visit: Payer: Self-pay

## 2020-08-29 DIAGNOSIS — Z20822 Contact with and (suspected) exposure to covid-19: Secondary | ICD-10-CM | POA: Diagnosis present

## 2020-08-29 DIAGNOSIS — Z98891 History of uterine scar from previous surgery: Secondary | ICD-10-CM

## 2020-08-29 DIAGNOSIS — E669 Obesity, unspecified: Secondary | ICD-10-CM | POA: Diagnosis present

## 2020-08-29 DIAGNOSIS — Z3A39 39 weeks gestation of pregnancy: Secondary | ICD-10-CM

## 2020-08-29 DIAGNOSIS — F411 Generalized anxiety disorder: Secondary | ICD-10-CM | POA: Diagnosis present

## 2020-08-29 DIAGNOSIS — O99214 Obesity complicating childbirth: Secondary | ICD-10-CM | POA: Diagnosis present

## 2020-08-29 DIAGNOSIS — Z8759 Personal history of other complications of pregnancy, childbirth and the puerperium: Secondary | ICD-10-CM

## 2020-08-29 DIAGNOSIS — O9952 Diseases of the respiratory system complicating childbirth: Secondary | ICD-10-CM | POA: Diagnosis present

## 2020-08-29 DIAGNOSIS — Z8659 Personal history of other mental and behavioral disorders: Secondary | ICD-10-CM

## 2020-08-29 DIAGNOSIS — J45909 Unspecified asthma, uncomplicated: Secondary | ICD-10-CM | POA: Diagnosis present

## 2020-08-29 DIAGNOSIS — O0990 Supervision of high risk pregnancy, unspecified, unspecified trimester: Secondary | ICD-10-CM

## 2020-08-29 DIAGNOSIS — Z9851 Tubal ligation status: Secondary | ICD-10-CM

## 2020-08-29 DIAGNOSIS — O34211 Maternal care for low transverse scar from previous cesarean delivery: Principal | ICD-10-CM | POA: Diagnosis present

## 2020-08-29 DIAGNOSIS — Z302 Encounter for sterilization: Secondary | ICD-10-CM | POA: Diagnosis not present

## 2020-08-29 SURGERY — Surgical Case
Anesthesia: Spinal

## 2020-08-29 MED ORDER — ONDANSETRON HCL 4 MG/2ML IJ SOLN
INTRAMUSCULAR | Status: DC | PRN
  Administered 2020-08-29: 4 mg via INTRAVENOUS

## 2020-08-29 MED ORDER — ONDANSETRON HCL 4 MG/2ML IJ SOLN: 4.0000 mg | Freq: Three times a day (TID) | INTRAMUSCULAR | Status: DC | PRN

## 2020-08-29 MED ORDER — HYDROMORPHONE HCL 1 MG/ML IJ SOLN
INTRAMUSCULAR | Status: AC
  Filled 2020-08-29: qty 0.5

## 2020-08-29 MED ORDER — BUPIVACAINE IN DEXTROSE 0.75-8.25 % IT SOLN
INTRATHECAL | Status: DC | PRN
  Administered 2020-08-29: 1.6 mL via INTRATHECAL

## 2020-08-29 MED ORDER — WITCH HAZEL-GLYCERIN EX PADS: 1.0000 "application " | MEDICATED_PAD | CUTANEOUS | Status: DC | PRN

## 2020-08-29 MED ORDER — NALOXONE HCL 0.4 MG/ML IJ SOLN: 0.4000 mg | INTRAMUSCULAR | Status: DC | PRN

## 2020-08-29 MED ORDER — DEXTROSE 5 % IV SOLN
INTRAVENOUS | Status: AC
  Filled 2020-08-29: qty 3000

## 2020-08-29 MED ORDER — SODIUM CHLORIDE 0.9% FLUSH: 3.0000 mL | INTRAVENOUS | Status: DC | PRN

## 2020-08-29 MED ORDER — DEXMEDETOMIDINE (PRECEDEX) IN NS 20 MCG/5ML (4 MCG/ML) IV SYRINGE
PREFILLED_SYRINGE | INTRAVENOUS | Status: AC
  Filled 2020-08-29: qty 5

## 2020-08-29 MED ORDER — SOD CITRATE-CITRIC ACID 500-334 MG/5ML PO SOLN
ORAL | Status: AC
  Filled 2020-08-29: qty 30

## 2020-08-29 MED ORDER — DIPHENHYDRAMINE HCL 50 MG/ML IJ SOLN: 12.5000 mg | INTRAMUSCULAR | Status: DC | PRN

## 2020-08-29 MED ORDER — IBUPROFEN 800 MG PO TABS
800.0000 mg | ORAL_TABLET | Freq: Three times a day (TID) | ORAL | Status: DC
  Administered 2020-08-29 – 2020-08-31 (×6): 800 mg via ORAL
  Filled 2020-08-29 (×6): qty 1

## 2020-08-29 MED ORDER — DEXMEDETOMIDINE (PRECEDEX) IN NS 20 MCG/5ML (4 MCG/ML) IV SYRINGE
PREFILLED_SYRINGE | INTRAVENOUS | Status: DC | PRN
  Administered 2020-08-29 (×2): 4 ug via INTRAVENOUS
  Administered 2020-08-29: 8 ug via INTRAVENOUS
  Administered 2020-08-29: 4 ug via INTRAVENOUS

## 2020-08-29 MED ORDER — FAMOTIDINE 20 MG PO TABS: 20.0000 mg | ORAL_TABLET | Freq: Two times a day (BID) | ORAL | Status: DC | PRN

## 2020-08-29 MED ORDER — COCONUT OIL OIL: 1.0000 "application " | TOPICAL_OIL | Status: DC | PRN

## 2020-08-29 MED ORDER — OXYTOCIN-SODIUM CHLORIDE 30-0.9 UT/500ML-% IV SOLN
INTRAVENOUS | Status: AC
  Filled 2020-08-29: qty 500

## 2020-08-29 MED ORDER — KETOROLAC TROMETHAMINE 30 MG/ML IJ SOLN
INTRAMUSCULAR | Status: DC | PRN
  Administered 2020-08-29: 30 mg via INTRAVENOUS

## 2020-08-29 MED ORDER — ONDANSETRON HCL 4 MG/2ML IJ SOLN
INTRAMUSCULAR | Status: AC
  Filled 2020-08-29: qty 2

## 2020-08-29 MED ORDER — KETOROLAC TROMETHAMINE 30 MG/ML IJ SOLN: 30.0000 mg | Freq: Four times a day (QID) | INTRAMUSCULAR | Status: AC | PRN

## 2020-08-29 MED ORDER — NALBUPHINE HCL 10 MG/ML IJ SOLN: 5.0000 mg | INTRAMUSCULAR | Status: DC | PRN

## 2020-08-29 MED ORDER — PHENYLEPHRINE HCL-NACL 20-0.9 MG/250ML-% IV SOLN
INTRAVENOUS | Status: AC
  Filled 2020-08-29: qty 250

## 2020-08-29 MED ORDER — SOD CITRATE-CITRIC ACID 500-334 MG/5ML PO SOLN
30.0000 mL | ORAL | Status: AC
  Administered 2020-08-29: 30 mL via ORAL

## 2020-08-29 MED ORDER — NALBUPHINE HCL 10 MG/ML IJ SOLN: 5.0000 mg | Freq: Once | INTRAMUSCULAR | Status: DC | PRN

## 2020-08-29 MED ORDER — LACTATED RINGERS IV SOLN: INTRAVENOUS | Status: DC | PRN

## 2020-08-29 MED ORDER — KETAMINE HCL 10 MG/ML IJ SOLN
INTRAMUSCULAR | Status: DC | PRN
  Administered 2020-08-29: 25 mg via INTRAVENOUS

## 2020-08-29 MED ORDER — SCOPOLAMINE 1 MG/3DAYS TD PT72
MEDICATED_PATCH | TRANSDERMAL | Status: AC
  Filled 2020-08-29: qty 1

## 2020-08-29 MED ORDER — HYDROMORPHONE HCL 1 MG/ML IJ SOLN
0.2500 mg | INTRAMUSCULAR | Status: DC | PRN
  Administered 2020-08-29 (×2): 0.5 mg via INTRAVENOUS

## 2020-08-29 MED ORDER — KETOROLAC TROMETHAMINE 30 MG/ML IJ SOLN
INTRAMUSCULAR | Status: AC
  Filled 2020-08-29: qty 1

## 2020-08-29 MED ORDER — SENNOSIDES-DOCUSATE SODIUM 8.6-50 MG PO TABS: 2.0000 | ORAL_TABLET | Freq: Every evening | ORAL | Status: DC | PRN

## 2020-08-29 MED ORDER — OXYCODONE HCL 5 MG PO TABS
5.0000 mg | ORAL_TABLET | ORAL | Status: DC | PRN
  Administered 2020-08-30: 5 mg via ORAL
  Administered 2020-08-30 (×2): 10 mg via ORAL
  Administered 2020-08-31: 5 mg via ORAL
  Filled 2020-08-29: qty 1
  Filled 2020-08-29: qty 2
  Filled 2020-08-29: qty 1
  Filled 2020-08-29: qty 2

## 2020-08-29 MED ORDER — KETAMINE HCL 50 MG/5ML IJ SOSY
PREFILLED_SYRINGE | INTRAMUSCULAR | Status: AC
  Filled 2020-08-29: qty 5

## 2020-08-29 MED ORDER — PHENYLEPHRINE HCL-NACL 20-0.9 MG/250ML-% IV SOLN
INTRAVENOUS | Status: DC | PRN
  Administered 2020-08-29: 60 ug/min via INTRAVENOUS

## 2020-08-29 MED ORDER — PHENYLEPHRINE HCL (PRESSORS) 10 MG/ML IV SOLN: INTRAVENOUS | Status: DC | PRN

## 2020-08-29 MED ORDER — TETANUS-DIPHTH-ACELL PERTUSSIS 5-2.5-18.5 LF-MCG/0.5 IM SUSY: 0.5000 mL | PREFILLED_SYRINGE | Freq: Once | INTRAMUSCULAR | Status: DC

## 2020-08-29 MED ORDER — MEPERIDINE HCL 25 MG/ML IJ SOLN: 6.2500 mg | INTRAMUSCULAR | Status: DC | PRN

## 2020-08-29 MED ORDER — DIBUCAINE (PERIANAL) 1 % EX OINT: 1.0000 "application " | TOPICAL_OINTMENT | CUTANEOUS | Status: DC | PRN

## 2020-08-29 MED ORDER — ALBUTEROL SULFATE (2.5 MG/3ML) 0.083% IN NEBU: 2.5000 mg | INHALATION_SOLUTION | RESPIRATORY_TRACT | Status: DC | PRN

## 2020-08-29 MED ORDER — PROPOFOL 10 MG/ML IV BOLUS
INTRAVENOUS | Status: DC | PRN
  Administered 2020-08-29 (×3): 30 mg via INTRAVENOUS
  Administered 2020-08-29: 20 mg via INTRAVENOUS

## 2020-08-29 MED ORDER — OXYTOCIN-SODIUM CHLORIDE 30-0.9 UT/500ML-% IV SOLN
INTRAVENOUS | Status: DC | PRN
  Administered 2020-08-29: 30 [IU] via INTRAVENOUS

## 2020-08-29 MED ORDER — PROPOFOL 10 MG/ML IV BOLUS
INTRAVENOUS | Status: AC
  Filled 2020-08-29: qty 20

## 2020-08-29 MED ORDER — LACTATED RINGERS IV SOLN: INTRAVENOUS | Status: DC

## 2020-08-29 MED ORDER — MENTHOL 3 MG MT LOZG
1.0000 | LOZENGE | OROMUCOSAL | Status: DC | PRN
  Administered 2020-08-31: 3 mg via ORAL
  Filled 2020-08-29: qty 9

## 2020-08-29 MED ORDER — OXYTOCIN-SODIUM CHLORIDE 30-0.9 UT/500ML-% IV SOLN: 2.5000 [IU]/h | INTRAVENOUS | Status: AC

## 2020-08-29 MED ORDER — SODIUM CHLORIDE 0.9 % IR SOLN
Status: DC | PRN
  Administered 2020-08-29: 1

## 2020-08-29 MED ORDER — FENTANYL CITRATE (PF) 100 MCG/2ML IJ SOLN
INTRAMUSCULAR | Status: AC
  Filled 2020-08-29: qty 2

## 2020-08-29 MED ORDER — ONDANSETRON HCL 4 MG/2ML IJ SOLN: 4.0000 mg | Freq: Once | INTRAMUSCULAR | Status: DC | PRN

## 2020-08-29 MED ORDER — ACETAMINOPHEN 325 MG PO TABS
650.0000 mg | ORAL_TABLET | ORAL | Status: DC | PRN
  Administered 2020-08-30 (×2): 650 mg via ORAL
  Filled 2020-08-29 (×2): qty 2

## 2020-08-29 MED ORDER — MORPHINE SULFATE (PF) 0.5 MG/ML IJ SOLN
INTRAMUSCULAR | Status: DC | PRN
  Administered 2020-08-29: 150 ug via INTRATHECAL

## 2020-08-29 MED ORDER — DIPHENHYDRAMINE HCL 25 MG PO CAPS: 25.0000 mg | ORAL_CAPSULE | ORAL | Status: DC | PRN

## 2020-08-29 MED ORDER — PRENATAL MULTIVITAMIN CH
1.0000 | ORAL_TABLET | Freq: Every day | ORAL | Status: DC
  Administered 2020-08-30 – 2020-08-31 (×2): 1 via ORAL
  Filled 2020-08-29 (×2): qty 1

## 2020-08-29 MED ORDER — ENOXAPARIN SODIUM 80 MG/0.8ML ~~LOC~~ SOLN
70.0000 mg | SUBCUTANEOUS | Status: DC
  Administered 2020-08-30 – 2020-08-31 (×2): 70 mg via SUBCUTANEOUS
  Filled 2020-08-29 (×2): qty 0.8

## 2020-08-29 MED ORDER — SCOPOLAMINE 1 MG/3DAYS TD PT72
1.0000 | MEDICATED_PATCH | Freq: Once | TRANSDERMAL | Status: DC
  Administered 2020-08-29: 1.5 mg via TRANSDERMAL

## 2020-08-29 MED ORDER — GABAPENTIN 100 MG PO CAPS
100.0000 mg | ORAL_CAPSULE | Freq: Two times a day (BID) | ORAL | Status: DC
  Administered 2020-08-29 – 2020-08-31 (×4): 100 mg via ORAL
  Filled 2020-08-29 (×4): qty 1

## 2020-08-29 MED ORDER — FENTANYL CITRATE (PF) 250 MCG/5ML IJ SOLN
INTRAMUSCULAR | Status: AC
  Filled 2020-08-29: qty 5

## 2020-08-29 MED ORDER — SIMETHICONE 80 MG PO CHEW
80.0000 mg | CHEWABLE_TABLET | Freq: Three times a day (TID) | ORAL | Status: DC
  Administered 2020-08-29 – 2020-08-31 (×6): 80 mg via ORAL
  Filled 2020-08-29 (×6): qty 1

## 2020-08-29 MED ORDER — DIPHENHYDRAMINE HCL 25 MG PO CAPS: 25.0000 mg | ORAL_CAPSULE | Freq: Four times a day (QID) | ORAL | Status: DC | PRN

## 2020-08-29 MED ORDER — MORPHINE SULFATE (PF) 0.5 MG/ML IJ SOLN
INTRAMUSCULAR | Status: AC
  Filled 2020-08-29: qty 10

## 2020-08-29 MED ORDER — DEXTROSE 5 % IV SOLN
3.0000 g | INTRAVENOUS | Status: AC
  Administered 2020-08-29: 3 g via INTRAVENOUS

## 2020-08-29 MED ORDER — FENTANYL CITRATE (PF) 100 MCG/2ML IJ SOLN
INTRAMUSCULAR | Status: DC | PRN
Start: 2020-08-29 — End: 2020-08-29
  Administered 2020-08-29 (×2): 50 ug via INTRAVENOUS
  Administered 2020-08-29: 15 ug via INTRAVENOUS
  Administered 2020-08-29: 85 ug via INTRAVENOUS
  Administered 2020-08-29: 100 ug via INTRAVENOUS

## 2020-08-29 MED ORDER — KETOROLAC TROMETHAMINE 30 MG/ML IJ SOLN: 30.0000 mg | Freq: Once | INTRAMUSCULAR | Status: DC | PRN

## 2020-08-29 MED ORDER — STERILE WATER FOR IRRIGATION IR SOLN
Status: DC | PRN
  Administered 2020-08-29: 1

## 2020-08-29 MED ORDER — NALOXONE HCL 4 MG/10ML IJ SOLN
1.0000 ug/kg/h | INTRAVENOUS | Status: DC | PRN
  Filled 2020-08-29: qty 5

## 2020-08-29 MED ORDER — POLYETHYLENE GLYCOL 3350 17 G PO PACK
17.0000 g | PACK | Freq: Every day | ORAL | Status: DC
  Administered 2020-08-30 – 2020-08-31 (×2): 17 g via ORAL
  Filled 2020-08-29 (×2): qty 1

## 2020-08-29 SURGICAL SUPPLY — 42 items
BENZOIN TINCTURE PRP APPL 2/3 (GAUZE/BANDAGES/DRESSINGS) ×3 IMPLANT
CANISTER SUCT 3000ML PPV (MISCELLANEOUS) ×3 IMPLANT
CHLORAPREP W/TINT 26ML (MISCELLANEOUS) ×3 IMPLANT
CLIP FILSHIE TUBAL LIGA STRL (Clip) ×6 IMPLANT
CLOSURE WOUND 1/2 X4 (GAUZE/BANDAGES/DRESSINGS) ×1
DRESSING PREVENA PLUS CUSTOM (GAUZE/BANDAGES/DRESSINGS) ×1 IMPLANT
DRSG OPSITE POSTOP 4X10 (GAUZE/BANDAGES/DRESSINGS) ×3 IMPLANT
DRSG PREVENA PLUS CUSTOM (GAUZE/BANDAGES/DRESSINGS) ×3
ELECT REM PT RETURN 9FT ADLT (ELECTROSURGICAL) ×3
ELECTRODE REM PT RTRN 9FT ADLT (ELECTROSURGICAL) ×1 IMPLANT
EXTRACTOR VACUUM KIWI (MISCELLANEOUS) ×3 IMPLANT
GLOVE BIOGEL PI IND STRL 7.0 (GLOVE) ×2 IMPLANT
GLOVE BIOGEL PI INDICATOR 7.0 (GLOVE) ×4
GLOVE INDICATOR 7.5 STRL GRN (GLOVE) ×3 IMPLANT
GLOVE SKINSENSE NS SZ7.0 (GLOVE) ×2
GLOVE SKINSENSE STRL SZ7.0 (GLOVE) ×1 IMPLANT
GOWN STRL REUS W/ TWL LRG LVL3 (GOWN DISPOSABLE) ×2 IMPLANT
GOWN STRL REUS W/ TWL XL LVL3 (GOWN DISPOSABLE) ×1 IMPLANT
GOWN STRL REUS W/TWL LRG LVL3 (GOWN DISPOSABLE) ×4
GOWN STRL REUS W/TWL XL LVL3 (GOWN DISPOSABLE) ×2
NS IRRIG 1000ML POUR BTL (IV SOLUTION) ×3 IMPLANT
PACK C SECTION WH (CUSTOM PROCEDURE TRAY) ×3 IMPLANT
PAD ABD 7.5X8 STRL (GAUZE/BANDAGES/DRESSINGS) ×3 IMPLANT
PAD OB MATERNITY 4.3X12.25 (PERSONAL CARE ITEMS) ×3 IMPLANT
PAD PREP 24X48 CUFFED NSTRL (MISCELLANEOUS) ×3 IMPLANT
PENCIL SMOKE EVAC W/HOLSTER (ELECTROSURGICAL) ×3 IMPLANT
SPONGE LAP 18X18 RF (DISPOSABLE) ×6 IMPLANT
STRIP CLOSURE SKIN 1/2X4 (GAUZE/BANDAGES/DRESSINGS) ×2 IMPLANT
SUT CHROMIC 1 CT1 27 (SUTURE) ×6 IMPLANT
SUT MNCRL 0 VIOLET CTX 36 (SUTURE) ×3 IMPLANT
SUT MON AB 4-0 PS1 27 (SUTURE) ×3 IMPLANT
SUT MONOCRYL 0 CTX 36 (SUTURE) ×6
SUT PLAIN 0 NONE (SUTURE) IMPLANT
SUT PLAIN 2 0 XLH (SUTURE) ×3 IMPLANT
SUT VIC AB 0 CT1 27 (SUTURE) ×4
SUT VIC AB 0 CT1 27XBRD ANBCTR (SUTURE) ×2 IMPLANT
SUT VIC AB 3-0 CT1 27 (SUTURE) ×2
SUT VIC AB 3-0 CT1 TAPERPNT 27 (SUTURE) ×1 IMPLANT
SUT VICRYL 2 0 18  TIES (SUTURE)
SUT VICRYL 2 0 18 TIES (SUTURE) IMPLANT
TOWEL OR 17X24 6PK STRL BLUE (TOWEL DISPOSABLE) ×6 IMPLANT
WATER STERILE IRR 1000ML POUR (IV SOLUTION) ×3 IMPLANT

## 2020-08-29 NOTE — Anesthesia Postprocedure Evaluation (Signed)
Anesthesia Post Note  Patient: Mary Campos  Procedure(s) Performed: CESAREAN SECTION WITH BILATERAL TUBAL LIGATION (N/A )     Patient location during evaluation: Mother Baby Anesthesia Type: Spinal Level of consciousness: oriented and awake and alert Pain management: pain level controlled Vital Signs Assessment: post-procedure vital signs reviewed and stable Respiratory status: spontaneous breathing and respiratory function stable Cardiovascular status: blood pressure returned to baseline and stable Postop Assessment: no headache, no backache, no apparent nausea or vomiting and able to ambulate Anesthetic complications: no   No complications documented.  Last Vitals:  Vitals:   08/29/20 1430 08/29/20 1438  BP:  (!) 91/40  Pulse: (!) 54 (!) 50  Resp: 17 18  Temp:  36.6 C  SpO2: 98% 100%    Last Pain:  Vitals:   08/29/20 1438  TempSrc: Oral  PainSc: 3                  Jashae Wiggs P Rohen Kimes

## 2020-08-29 NOTE — Progress Notes (Signed)
ANTICOAGULATION CONSULT NOTE - Initial Consult  Pharmacy Consult for Lovenox for VTE prophylaxis   Allergies  Allergen Reactions  . Azithromycin Shortness Of Breath    Stomach spasms   . Eletriptan Hydrobromide Shortness Of Breath    Stomach spasms  . Pseudoephedrine Palpitations    Loses focus   . Tape Rash    Surgical tape    Patient Measurements: Height: 5\' 2"  (157.5 cm) Weight: 135.6 kg (299 lb) IBW/kg (Calculated) : 50.1  Vital Signs: Temp: 97.9 F (36.6 C) (11/08 1438) Temp Source: Oral (11/08 1438) BP: 91/40 (11/08 1438) Pulse Rate: 50 (11/08 1438)  Labs: No results for input(s): HGB, HCT, PLT, APTT, LABPROT, INR, HEPARINUNFRC, HEPRLOWMOCWT, CREATININE, CKTOTAL, CKMB, TROPONINIHS in the last 72 hours.  CrCl cannot be calculated (Patient's most recent lab result is older than the maximum 21 days allowed.).   Medical History: Past Medical History:  Diagnosis Date  . Asthma    albuteral inhaler PRN  . Depression    hx mild ppd  . History of pancreatitis   . Vaginal Pap smear, abnormal        Plan:  Lovenox 70mg  (0.5mg /kg) sq q24h for post Csection DVT prophylaxis starting POD #1.   09-17-2004 08/29/2020,3:49 PM

## 2020-08-29 NOTE — H&P (Signed)
Obstetrics Admission History & Physical  08/29/2020 - 10:48 AM Primary OBGYN: [facility]  Chief Complaint: scheduled rpt c-section  History of Present Illness  34 y.o. [id number] @ [redacted]w[redacted]d, with the above CC. Pregnancy complicated by: BMI 50, h/o c-section x 4, h/o anxiety.  Ms. Mary Campos states that she is doing well  Review of Systems: as noted in the History of Present Illness.  Patient Active Problem List   Diagnosis Date Noted  . Previous cesarean section 04/20/2020  . Family history of spina bifida 02/27/2020  . Supervision of high risk pregnancy, antepartum 02/24/2020  . Cervical dysplasia 11/16/2019  . History of postpartum depression 04/11/2018  . BMI 50.0-59.9, adult (HCC) 02/21/2018  . Maternal obesity, antepartum 08/29/2017  . History of gestational hypertension 08/26/2017  . Anxiety state 08/11/2010  . Asthma 08/11/2010     PMHx:  Past Medical History:  Diagnosis Date  . Asthma    albuteral inhaler PRN  . Depression    hx mild ppd  . History of pancreatitis   . Vaginal Pap smear, abnormal    PSHx:  Past Surgical History:  Procedure Laterality Date  . CESAREAN SECTION     X 2  . CESAREAN SECTION N/A 04/11/2018   Procedure: REPEAT CESAREAN SECTION;  Surgeon: Texola Bing, MD;  Location: [facility] BIRTHING SUITES;  Service: Obstetrics;  Laterality: N/A;  . CESAREAN SECTION WITH BILATERAL TUBAL LIGATION Bilateral 07/03/2016   Procedure: CESAREAN SECTION/ Female 8lbs. 3oz;  Surgeon: Nadara Mustard, MD;  Location: [facility] ORS;  Service: Obstetrics;  Laterality: Bilateral;  . CHOLECYSTECTOMY    . COLPOSCOPY W/ BIOPSY / CURETTAGE  11/12/2019      . TONSILLECTOMY     Medications:  Medications Prior to Admission  Medication Sig Dispense Refill Last Dose  . albuterol (VENTOLIN HFA) 108 (90 Base) MCG/ACT inhaler Inhale 2 puffs into the lungs every 4 (four) hours as needed for wheezing or shortness of breath. 18 g 2   . aspirin EC 81 MG tablet Take 1 tablet (81 mg  total) by mouth daily. Take after 12 weeks for prevention of preeclampsia later in pregnancy (Patient taking differently: Take 81 mg by mouth 2 (two) times a week. Take after 12 weeks for prevention of preeclampsia later in pregnancy) 300 tablet 2   . famotidine (PEPCID) 20 MG tablet Take 20 mg by mouth 2 (two) times daily as needed for heartburn or indigestion.     . fexofenadine (ALLEGRA) 180 MG tablet Take 180 mg by mouth daily as needed for allergies or rhinitis (allergies (alternates between allegra, singulair & claritin)).      . folic acid (FOLVITE) 1 MG tablet Take 1 tablet (1 mg total) by mouth daily. (Patient taking differently: Take 1 mg by mouth 2 (two) times a week. ) 30 tablet 10   . ibuprofen (ADVIL) 200 MG tablet Take 600 mg by mouth every 8 (eight) hours as needed (pain/headache).     . loratadine (CLARITIN) 10 MG tablet Take 10 mg by mouth at bedtime as needed (allergies (alternates between allegra, singulair & claritin)).      . montelukast (SINGULAIR) 10 MG tablet Take 1 tablet (10 mg total) by mouth at bedtime as needed. (Patient taking differently: Take 10 mg by mouth at bedtime as needed (allergies (alternates between allegra, singulair & claritin)). ) 30 tablet 0   . ondansetron (ZOFRAN ODT) 4 MG disintegrating tablet Take 1 tablet (4 mg total) by mouth every 6 (six) hours as needed for  nausea. 20 tablet 0   . Prenatal Vit-Fe Fumarate-FA (PRENATAL PO) Take 1 tablet by mouth every evening.      . prochlorperazine (COMPAZINE) 10 MG tablet Take 1 tablet (10 mg total) by mouth every 6 (six) hours as needed for nausea or vomiting. 30 tablet 3   . promethazine (PHENERGAN) 25 MG tablet Take 1/2 to 1 tablet po every 6 hours as needed for nausea (Patient taking differently: Take 12.5-25 mg by mouth every 6 (six) hours as needed for nausea. ) 30 tablet 2      Allergies: is allergic to azithromycin, eletriptan hydrobromide, pseudoephedrine, and tape. OBHx:  OB History  Gravida Para Term  Preterm AB Living  5 4 4  0 0 4  SAB TAB Ectopic Multiple Live Births  0 0 0 0 4    # Outcome Date GA Lbr Len/2nd Weight Sex Delivery Anes PTL Lv  5 Current           4 Term 04/11/18 [redacted]w[redacted]d  3310 g F CS-LTranv Spinal  LIV  3 Term 07/03/16 [redacted]w[redacted]d  3700 g M CS-LTranv Spinal  LIV  2 Term     F CS-LTranv   LIV  1 Term     F CS-LTranv  Y LIV         FHx:  Family History  Problem Relation Age of Onset  . Hypertension Mother    Soc Hx:  Social History   Socioeconomic History  . Marital status: Married    Spouse name: Not on file  . Number of children: Not on file  . Years of education: Not on file  . Highest education level: Not on file  Occupational History  . Not on file  Tobacco Use  . Smoking status: Never Smoker  . Smokeless tobacco: Never Used  Vaping Use  . Vaping Use: Never used  Substance and Sexual Activity  . Alcohol use: No  . Drug use: No  . Sexual activity: Yes    Birth control/protection: None  Other Topics Concern  . Not on file  Social History Narrative  . Not on file   Social Determinants of Health   Financial Resource Strain:   . Difficulty of Paying Living Expenses: Not on file  Food Insecurity:   . Worried About [redacted]w[redacted]d in the Last Year: Not on file  . Ran Out of Food in the Last Year: Not on file  Transportation Needs:   . Lack of Transportation (Medical): Not on file  . Lack of Transportation (Non-Medical): Not on file  Physical Activity:   . Days of Exercise per Week: Not on file  . Minutes of Exercise per Session: Not on file  Stress:   . Feeling of Stress : Not on file  Social Connections:   . Frequency of Communication with Friends and Family: Not on file  . Frequency of Social Gatherings with Friends and Family: Not on file  . Attends Religious Services: Not on file  . Active Member of Clubs or Organizations: Not on file  . Attends Programme researcher, broadcasting/film/video Meetings: Not on file  . Marital Status: Not on file  Intimate Partner  Violence:   . Fear of Current or Ex-Partner: Not on file  . Emotionally Abused: Not on file  . Physically Abused: Not on file  . Sexually Abused: Not on file    Objective    Current Vital Signs 24h Vital Sign Ranges  T 98 F (36.7 C) Temp  Avg:  98 F (36.7 C)  Min: 98 F (36.7 C)  Max: 98 F (36.7 C)  BP (!) 110/51 BP  Min: 110/51  Max: 110/51  HR 72 Pulse  Avg: 72  Min: 72  Max: 72  RR 18 Resp  Avg: 18  Min: 18  Max: 18  SaO2 100 % Room Air SpO2  Avg: 100 %  Min: 100 %  Max: 100 %       24 Hour I/O Current Shift I/O  Time Ins Outs No intake/output data recorded. No intake/output data recorded.   FHTs 150s  General: Well nourished, well developed female in no acute distress.  Skin:  Warm and dry.  Cardiovascular: S1, S2 normal, no murmur, rub or gallop, regular rate and rhythm Respiratory:  Clear to auscultation bilateral. Normal respiratory effort Abdomen: gravid, nttp Neuro/Psych:  Normal mood and affect.   Labs  Negative: covid A POS  Recent Labs  Lab 08/26/20 1025  WBC 7.1  HGB 12.8  HCT 36.7  PLT 246    Radiology No new imaging Posterior fundal placenta  Assessment & Plan   35 y.o. M0N0272 @ [redacted]w[redacted]d doing well *Pregnancy: d/w pt and she still desires BTL via salpingectomy. Pt consented for rpt and salpingectomy if technically feasible. Plan for prevena incision placement Can proceed when OR is ready  Cornelia Copa MD Attending Center for [facility])

## 2020-08-29 NOTE — Anesthesia Procedure Notes (Signed)
Spinal  Patient location during procedure: OR Start time: 08/29/2020 11:25 AM End time: 08/29/2020 11:31 AM Staffing Performed: anesthesiologist  Anesthesiologist: Atilano Median, DO Preanesthetic Checklist Completed: patient identified, IV checked, site marked, risks and benefits discussed, surgical consent, monitors and equipment checked, pre-op evaluation and timeout performed Spinal Block Patient position: sitting Prep: DuraPrep Patient monitoring: heart rate, cardiac monitor, continuous pulse ox and blood pressure Approach: midline Location: L3-4 Injection technique: single-shot Needle Needle type: Pencan  Needle gauge: 24 G Needle length: 10 cm Additional Notes Patient identified. Risks/Benefits/Options discussed with patient including but not limited to bleeding, infection, nerve damage, paralysis, failed block, incomplete pain control, headache, blood pressure changes, nausea, vomiting, reactions to medications, itching and postpartum back pain. Confirmed with bedside nurse the patient's most recent platelet count. Confirmed with patient that they are not currently taking any anticoagulation, have any bleeding history or any family history of bleeding disorders. Patient expressed understanding and wished to proceed. All questions were answered. Sterile technique was used throughout the entire procedure. Please see nursing notes for vital signs. Warning signs of high block given to the patient including shortness of breath, tingling/numbness in hands, complete motor block, or any concerning symptoms with instructions to call for help. Patient was given instructions on fall risk and not to get out of bed. All questions and concerns addressed with instructions to call with any issues or inadequate analgesia.

## 2020-08-29 NOTE — Anesthesia Preprocedure Evaluation (Addendum)
Anesthesia Evaluation  Patient identified by MRN, date of birth, ID band Patient awake    Reviewed: Patient's Chart, lab work & pertinent test results  Airway Mallampati: II  TM Distance: >3 FB Neck ROM: Full    Dental  (+) Teeth Intact   Pulmonary asthma ,    Pulmonary exam normal        Cardiovascular negative cardio ROS   Rhythm:Regular Rate:Normal     Neuro/Psych Anxiety Depression    GI/Hepatic negative GI ROS, Neg liver ROS,   Endo/Other  negative endocrine ROS  Renal/GU negative Renal ROS  negative genitourinary   Musculoskeletal negative musculoskeletal ROS (+)   Abdominal (+)  Abdomen: soft. Bowel sounds: normal.  Peds  Hematology negative hematology ROS (+)   Anesthesia Other Findings   Reproductive/Obstetrics (+) Pregnancy                             Anesthesia Physical Anesthesia Plan  ASA: III  Anesthesia Plan: Spinal   Post-op Pain Management:    Induction:   PONV Risk Score and Plan: 2  Airway Management Planned: Natural Airway, Simple Face Mask and Nasal Cannula  Additional Equipment: None  Intra-op Plan:   Post-operative Plan:   Informed Consent: I have reviewed the patients History and Physical, chart, labs and discussed the procedure including the risks, benefits and alternatives for the proposed anesthesia with the patient or authorized representative who has indicated his/her understanding and acceptance.     Dental advisory given  Plan Discussed with: CRNA  Anesthesia Plan Comments: (Lab Results      Component                Value               Date                      WBC                      7.1                 08/26/2020                HGB                      12.8                08/26/2020                HCT                      36.7                08/26/2020                MCV                      90.0                08/26/2020                 PLT                      246                 08/26/2020          )  Anesthesia Quick Evaluation  

## 2020-08-29 NOTE — Discharge Summary (Signed)
Postpartum Discharge Summary    Patient Name: Mary Campos DOB: [birth year] MRN: [id number]  Date of admission: 08/29/2020 Delivery date:08/29/2020  Delivering provider: Aletha Halim  Date of discharge: 08/31/2020  Admitting diagnosis: Supervision of high risk pregnancy, antepartum [O09.90] Status post cesarean section [Z98.891] Intrauterine pregnancy: [redacted]w[redacted]d     Secondary diagnosis:  Principal Problem:   Cesarean delivery delivered Active Problems:   Anxiety state   Asthma   History of gestational hypertension   History of postpartum depression   Supervision of high risk pregnancy, antepartum   Previous cesarean section   History of bilateral tubal ligation   Status post cesarean section  Additional problems: as noted above   Discharge diagnosis: Repeat Cesarean with Postpartum BTL (Filshie Clips)                                            Post partum procedures:postpartum tubal ligation Augmentation: N/A Complications: None  Hospital course: Sceduled C/S   34 y.o. yo G5P4004 at [redacted]w[redacted]d was admitted to the hospital 08/29/2020 for scheduled cesarean section with the following indication: history of Cesarean x4. Delivery details are as follows:  Membrane Rupture Time/Date: 12:00 PM ,08/29/2020   Delivery Method:C-Section, Low Transverse  Details of operation can be found in separate operative note. Bilateral tubal ligation with Filshie Clips performed immediately postpartum. A Prevena wound vac was placed given history of prior PP wound infection. Patient had an uncomplicated postpartum course.  She is ambulating, tolerating a regular diet, passing flatus, and urinating well. Patient is discharged home in stable condition on  08/31/20        Newborn Data: Birth date:08/29/2020  Birth time:12:00 PM  Gender:Female  Living status:Living  Apgars:8 ,8  Weight:3020 g     Magnesium Sulfate received: No BMZ received: No Rhophylac:N/A MMR:N/A T-DaP:Given prenatally Flu: No ,  declined prenatally Transfusion:No  Physical exam  Vitals:   08/30/20 1030 08/30/20 1809 08/30/20 2105 08/31/20 0509  BP:  (!) 113/56 (!) 117/47 (!) 92/41  Pulse:  (!) 57 62 (!) 50  Resp:  $Remo'17 18 18  'grITF$ Temp:  98.5 F (36.9 C) 98.2 F (36.8 C) 98.4 F (36.9 C)  TempSrc:   Oral Oral  SpO2: 99% 100% 100% 100%  Weight:      Height:       General: alert, cooperative and no distress Lochia: appropriate Uterine Fundus: firm Incision: wound vac in place with good seal DVT Evaluation: No evidence of DVT seen on physical exam. No significant calf/ankle edema. Labs: Lab Results  Component Value Date   WBC 8.7 08/30/2020   HGB 10.3 (L) 08/30/2020   HCT 30.4 (L) 08/30/2020   MCV 92.4 08/30/2020   PLT 203 08/30/2020   CMP Latest Ref Rng & Units 02/24/2020  Glucose 65 - 99 mg/dL 94  BUN 6 - 20 mg/dL 13  Creatinine 0.57 - 1.00 mg/dL 0.80  Sodium 134 - 144 mmol/L 140  Potassium 3.5 - 5.2 mmol/L 3.9  Chloride 96 - 106 mmol/L 105  CO2 20 - 29 mmol/L 21  Calcium 8.7 - 10.2 mg/dL 9.1  Total Protein 6.0 - 8.5 g/dL 5.9(L)  Total Bilirubin 0.0 - 1.2 mg/dL 0.4  Alkaline Phos 39 - 117 IU/L 115  AST 0 - 40 IU/L 14  ALT 0 - 32 IU/L 17   Edinburgh Score: Edinburgh Postnatal Depression Scale Screening Tool 08/30/2020  I  have been able to laugh and see the funny side of things. 0  I have looked forward with enjoyment to things. 0  I have blamed myself unnecessarily when things went wrong. 1  I have been anxious or worried for no good reason. 3  I have felt scared or panicky for no good reason. 1  Things have been getting on top of me. 3  I have been so unhappy that I have had difficulty sleeping. 2  I have felt sad or miserable. 0  I have been so unhappy that I have been crying. 1  The thought of harming myself has occurred to me. 0  Edinburgh Postnatal Depression Scale Total 11     After visit meds:  Allergies as of 08/31/2020      Reactions   Azithromycin Shortness Of Breath    Stomach spasms    Eletriptan Hydrobromide Shortness Of Breath   Stomach spasms   Pseudoephedrine Palpitations   Loses focus    Tape Rash   Surgical tape      Medication List    STOP taking these medications   aspirin EC 81 MG tablet   famotidine 20 MG tablet Commonly known as: PEPCID   folic acid 1 MG tablet Commonly known as: FOLVITE   ondansetron 4 MG disintegrating tablet Commonly known as: Zofran ODT   prochlorperazine 10 MG tablet Commonly known as: COMPAZINE   promethazine 25 MG tablet Commonly known as: PHENERGAN     TAKE these medications   acetaminophen 325 MG tablet Commonly known as: TYLENOL Take 2 tablets (650 mg total) by mouth every 4 (four) hours as needed for mild pain (temperature > 101.5.).   albuterol 108 (90 Base) MCG/ACT inhaler Commonly known as: Ventolin HFA Inhale 2 puffs into the lungs every 4 (four) hours as needed for wheezing or shortness of breath.   fexofenadine 180 MG tablet Commonly known as: ALLEGRA Take 180 mg by mouth daily as needed for allergies or rhinitis (allergies (alternates between allegra, singulair & claritin)).   ibuprofen 800 MG tablet Commonly known as: ADVIL Take 1 tablet (800 mg total) by mouth every 8 (eight) hours. What changed:   medication strength  how much to take  when to take this  reasons to take this   loratadine 10 MG tablet Commonly known as: CLARITIN Take 10 mg by mouth at bedtime as needed (allergies (alternates between allegra, singulair & claritin)).   montelukast 10 MG tablet Commonly known as: Singulair Take 1 tablet (10 mg total) by mouth at bedtime as needed. What changed: reasons to take this   oxyCODONE 5 MG immediate release tablet Commonly known as: Oxy IR/ROXICODONE Take 1-2 tablets (5-10 mg total) by mouth every 4 (four) hours as needed for moderate pain.   polyethylene glycol 17 g packet Commonly known as: MIRALAX / GLYCOLAX Take 17 g by mouth daily. Start taking on:  September 01, 2020   PRENATAL PO Take 1 tablet by mouth every evening.            Discharge Care Instructions  (From admission, onward)         Start     Ordered   08/31/20 0000  Leave dressing on - Keep it clean, dry, and intact until clinic visit        08/31/20 0918           Discharge home in stable condition Infant Feeding: Breast Infant Disposition:home with mother Discharge instruction: per After Visit Summary and  Postpartum booklet. Activity: Advance as tolerated. Pelvic rest for 6 weeks.  Diet: routine diet Future Appointments: Future Appointments  Date Time Provider [place]  09/05/2020  1:15 PM [facility] NURSE [facility]  09/29/2020  1:15 PM Aletha Halim, MD [facility]   Follow up Visit: Message sent to [facility] on 08/29/20.  Please schedule this patient for a In person postpartum visit in 4 weeks with the following provider: Any provider. Additional Postpartum F/U:Postpartum Depression checkup (h/o anxiety & pp depression) 1 wwek, Incision check 1 week and BP check 1 week  High risk pregnancy complicated by: now h/o Cesarean x5, anxiety, h/o gHTN in prior pregnancy, h/o PP depression Delivery mode:  C-Section, Low Transverse  Anticipated Birth Control:  BTL done [facility]  08/31/2020 Clarnce Flock, MD

## 2020-08-29 NOTE — Transfer of Care (Signed)
Immediate Anesthesia Transfer of Care Note  Patient: Mary Campos  Procedure(s) Performed: CESAREAN SECTION WITH BILATERAL TUBAL LIGATION (N/A )  Patient Location: PACU  Anesthesia Type:Spinal  Level of Consciousness: awake  Airway & Oxygen Therapy: Patient Spontanous Breathing  Post-op Assessment: Report given to RN and Post -op Vital signs reviewed and stable  Post vital signs: Reviewed and stable  Last Vitals:  Vitals Value Taken Time  BP 97/53 08/29/20 1333  Temp    Pulse 68 08/29/20 1334  Resp 9 08/29/20 1334  SpO2 100 % 08/29/20 1334  Vitals shown include unvalidated device data.  Last Pain:  Vitals:   08/29/20 1023  TempSrc: Oral         Complications: No complications documented.

## 2020-08-29 NOTE — Op Note (Signed)
Operative Note   SURGERY DATE: 08/29/2020  PRE-OP DIAGNOSIS:  *Pregnancy at 39/1 *History of cesarean x 4 and desire for repeat *Desire for permanent sterilization  POST-OP DIAGNOSIS: Same. Delivered   PROCEDURE: Repeat low transverse cesarean section via pfannenstiel skin incision with double layer uterine closure and bilateral tubal ligation via Filshie clips and Prevena incision device placement  SURGEON: Surgeon(s) and Role:    * Florence Bing, MD - Primary  ASSISTANT:    * Sheila Oats, MD - Assisting    * Venora Maples, MD - Assisting  An experienced assistant was required given the standard of surgical care given the complexity of the case.  This assistant was needed for exposure, dissection, suctioning, retraction, instrument exchange, assisting with delivery with administration of fundal pressure, and for overall help during the procedure   ANESTHESIA: spinal  ESTIMATED BLOOD LOSS:  DRAINS: UOP via indwelling foley  TOTAL IV FLUIDS: crystalloid  VTE PROPHYLAXIS: SCDs to bilateral lower extremities  ANTIBIOTICS: Three grams of Cefazolin were given., within 1 hour of skin incision  SPECIMENS: none  COMPLICATIONS: none  FINDINGS: Of note, there were no intra-abdominal adhesions were noted. Grossly normal uterus, tubes and ovaries. Clear amniotic fluid, cephalic, female infant, weight 3020gm, APGARs 8/8, intact placenta.  PROCEDURE IN DETAIL: The patient was taken to the operating room where anesthesia was administered and normal fetal heart tones were confirmed. She was then prepped and draped in the normal fashion in the dorsal supine position with a leftward tilt.  After a time out was performed, a pfannensteil skin incision was made with the scalpel and carried through to the underlying layer of fascia. The fascia was then incised at the midline and this incision was extended laterally with the mayo scissors. Attention was turned to the  superior aspect of the fascial incision which was grasped with the kocher clamps x 2, tented up and the rectus muscles were dissected off with the scalpel. In a similar fashion the inferior aspect of the fascial incision was grasped with the kocher clamps, tented up and the rectus muscles dissected off with the mayo scissors. The rectus muscles were then separated in the midline and the peritoneum was entered bluntly. The bladder blade was inserted and the vesicouterine peritoneum was identified, tented up and entered with the metzenbaum scissors. This incision was extended laterally and the bladder flap was created digitally. The bladder blade was reinserted.  A low transverse hysterotomy was made with the scalpel until the endometrial cavity was breached and the amniotic sac ruptured with the Allis clamp, yielding clear amniotic fluid. This incision was extended bluntly and the infant's head, shoulders and body were delivered atraumatically.The cord was clamped x 2 and cut, and the infant was handed to the awaiting pediatricians, after delayed cord clamping was done.  The placenta was then gradually expressed from the uterus and then the uterus was exteriorized and cleared of all clots and debris. The hysterotomy was repaired with a running suture of 1-0 monocryl. A second imbricating layer of 1-0 monocryl suture was then placed. Several figure-of-eight sutures of #1 chromic were added to achieve excellent hemostasis.   The left Fallopian tube was identified by tracing out to the fimbraie, grasped with the Babcock clamps. An avascular midsection of the tube approximately 3-4cm from the cornua was grasped with the babcock clamps and the filshie clip was applied, taking care to incorporate the entire tube.  Attention was then turned to the right fallopian  tube after confirmation by tracing the tube out to the fimbriae. The same procedure was then performed on the right Fallopian tube, with excellent hemostasis  was noted from both BTL sites.    The uterus and adnexa were then returned to the abdomen, and the hysterotomy and all operative sites were reinspected and excellent hemostasis was noted after irrigation and suction of the abdomen with warm saline.  The peritoneum was closed with a running stitch of 3-0 Vicryl. The fascia was reapproximated with 0 Vicryl in a simple running fashion bilaterally. The subcutaneous layer was then reapproximated with interrupted sutures of 2-0 plain gut, and the skin was then closed with 4-0 vicryl, in a subcuticular fashion.  A Prevena incision device was then placed  The patient  tolerated the procedure well. Sponge, lap, needle, and instrument counts were correct x 2. The patient was transferred to the recovery room awake, alert and breathing independently in stable condition.  Cornelia Copa MD Attending Center for [facility])

## 2020-08-30 ENCOUNTER — Encounter (HOSPITAL_COMMUNITY): Payer: Self-pay | Admitting: Obstetrics and Gynecology

## 2020-08-30 LAB — CBC
HCT: 30.4 % — ABNORMAL LOW (ref 36.0–46.0)
Hemoglobin: 10.3 g/dL — ABNORMAL LOW (ref 12.0–15.0)
MCH: 31.3 pg (ref 26.0–34.0)
MCHC: 33.9 g/dL (ref 30.0–36.0)
MCV: 92.4 fL (ref 80.0–100.0)
Platelets: 203 10*3/uL (ref 150–400)
RBC: 3.29 MIL/uL — ABNORMAL LOW (ref 3.87–5.11)
RDW: 14.6 % (ref 11.5–15.5)
WBC: 8.7 10*3/uL (ref 4.0–10.5)
nRBC: 0 % (ref 0.0–0.2)

## 2020-08-30 NOTE — Progress Notes (Signed)
Subjective: POD#1: rLTCS/BTL  Patient is doing well without complaints. Ambulating without difficulty. Foley catheter still in place. Patient has not passed flatus since surgery. Tolerating PO. Abdominal pain improved. Vaginal bleeding decreased.  Objective: Vital signs in last 24 hours: Temp:  [96.7 F (35.9 C)-98.8 F (37.1 C)] 98.7 F (37.1 C) (11/09 0412) Pulse Rate:  [50-72] 59 (11/09 0412) Resp:  [13-26] 18 (11/09 0412) BP: (90-110)/(40-81) 105/54 (11/09 0412) SpO2:  [96 %-100 %] 99 % (11/09 0412) Weight:  [135.6 kg] 135.6 kg (11/08 1023)  Physical Exam:  General: alert, cooperative and no distress Lochia: appropriate Uterine Fundus: firm Incision: wound vac in place, no surrounding erythema or drainage DVT Evaluation: No evidence of DVT seen on physical exam.  Recent Labs    08/30/20 0536  HGB 10.3*  HCT 30.4*    Assessment/Plan: POD#1 rLTCS/BTL  -doing well without complaints  -monitor for UOP and flatus  -hgb 12.8>10.3, no intervention  #History of anxiety/postpartum depression  -mood stable  -not on meds  -SW consulted, continue to monitor  #History of gHTN  -never on meds  -asymptomatic  -BP well controlled  Plan for discharge tomorrow if desired.  Mary Campos 08/30/2020, 5:59 AM

## 2020-08-30 NOTE — Progress Notes (Signed)
Spoke with MD regarding pt blood pressure of 98/44. MD aware pt has no complaints and feels fine. No new orders but to continue to monitor pt.

## 2020-08-30 NOTE — Social Work (Signed)
CSW received consult for hx of Anxiety and Postpartum Depression.  CSW met with MOB to offer support and complete assessment.     CSW introduced self and role. CSW observed FOB bedside and MOB in bed holding sleeping newborn. MOB was engaged throughout assessment. CSW informed MOB of reason for consult. MOB expressed understanding and confirmed a diagnosis of anxiety, dating back more than eight years ago. MOB disclosed she also experienced PPD following the birth of her last child in 2019. MOB shared she experienced some general anxiety during pregnancy. MOB denied any additional symptoms. MOB stated the PPD presented itself as feelings of being overwhelmed and tired. MOB disclosed she was prescribed Zoloft after last child which was not helpful. MOB has not been on any medications in over a year. MOB stated she copes by praying. MOB expressed she is currently feeling great and denies any negative mental health symptoms at this time. MOB identified FOB and her mother as supports.  CSW provided education regarding the baby blues period vs. perinatal mood disorders, discussed treatment and gave resources for mental health follow up if concerns arise.  CSW recommends self-evaluation during the postpartum time period using the New Mom Checklist from Postpartum Progress and encouraged MOB to contact a medical professional if symptoms are noted at any time.   CSW provided review of Sudden Infant Death Syndrome (SIDS) precautions.  MOB stated newborn will sleep in a bassinet once discharged home. Newborn will receive follow-up care at [facility]. MOB denies any transportation barriers. MOB has all of the essential needs for newborn, including a new carseat. MOB denies having any additional needs or resources at this time.  CSW identifies no further need for intervention and no barriers to discharge at this time.  Mary Campos, LCSWA Clinical Social Work [facility] [phone] 

## 2020-08-31 DIAGNOSIS — Z9851 Tubal ligation status: Secondary | ICD-10-CM

## 2020-08-31 DIAGNOSIS — Z98891 History of uterine scar from previous surgery: Secondary | ICD-10-CM | POA: Diagnosis not present

## 2020-08-31 MED ORDER — ACETAMINOPHEN 325 MG PO TABS: 650.0000 mg | ORAL_TABLET | ORAL | 0 refills | Status: DC | PRN

## 2020-08-31 MED ORDER — POLYETHYLENE GLYCOL 3350 17 G PO PACK
17.0000 g | PACK | Freq: Every day | ORAL | 0 refills | Status: DC
Start: 2020-09-01 — End: 2020-10-18

## 2020-08-31 MED ORDER — IBUPROFEN 800 MG PO TABS: 800.0000 mg | ORAL_TABLET | Freq: Three times a day (TID) | ORAL | 0 refills | Status: AC

## 2020-08-31 MED ORDER — OXYCODONE HCL 5 MG PO TABS
5.0000 mg | ORAL_TABLET | ORAL | 0 refills | Status: DC | PRN
Start: 2020-08-31 — End: 2020-10-18

## 2020-08-31 NOTE — Discharge Instructions (Signed)
Postpartum Care After Cesarean Delivery This sheet gives you information about how to care for yourself from the time you deliver your baby to up to 6-12 weeks after delivery (postpartum period). Your health care provider may also give you more specific instructions. If you have problems or questions, contact your health care provider. Follow these instructions at home: Medicines  Take over-the-counter and prescription medicines only as told by your health care provider.  If you were prescribed an antibiotic medicine, take it as told by your health care provider. Do not stop taking the antibiotic even if you start to feel better.  Ask your health care provider if the medicine prescribed to you: ? Requires you to avoid driving or using heavy machinery. ? Can cause constipation. You may need to take actions to prevent or treat constipation, such as:  Drink enough fluid to keep your urine pale yellow.  Take over-the-counter or prescription medicines.  Eat foods that are high in fiber, such as beans, whole grains, and fresh fruits and vegetables.  Limit foods that are high in fat and processed sugars, such as fried or sweet foods. Activity  Gradually return to your normal activities as told by your health care provider.  Avoid activities that take a lot of effort and energy (are strenuous) until approved by your health care provider. Walking at a slow to moderate pace is usually safe. Ask your health care provider what activities are safe for you. ? Do not lift anything that is heavier than your baby or 10 lb (4.5 kg) as told by your health care provider. ? Do not vacuum, climb stairs, or drive a car for as long as told by your health care provider.  If possible, have someone help you at home until you are able to do your usual activities yourself.  Rest as much as possible. Try to rest or take naps while your baby is sleeping. Vaginal bleeding  It is normal to have vaginal bleeding  (lochia) after delivery. Wear a sanitary pad to absorb vaginal bleeding and discharge. ? During the first week after delivery, the amount and appearance of lochia is often similar to a menstrual period. ? Over the next few weeks, it will gradually decrease to a dry, yellow-brown discharge. ? For most women, lochia stops completely by 4-6 weeks after delivery. Vaginal bleeding can vary from woman to woman.  Change your sanitary pads frequently. Watch for any changes in your flow, such as: ? A sudden increase in volume. ? A change in color. ? Large blood clots.  If you pass a blood clot, save it and call your health care provider to discuss. Do not flush blood clots down the toilet before you get instructions from your health care provider.  Do not use tampons or douches until your health care provider says this is safe.  If you are not breastfeeding, your period should return 6-8 weeks after delivery. If you are breastfeeding, your period may return anytime between 8 weeks after delivery and the time that you stop breastfeeding. Perineal care   If your C-section (Cesarean section) was unplanned, and you were allowed to labor and push before delivery, you may have pain, swelling, and discomfort of the tissue between your vaginal opening and your anus (perineum). You may also have an incision in the tissue (episiotomy) or the tissue may have torn during delivery. Follow these instructions as told by your health care provider: ? Keep your perineum clean and dry as told by   your health care provider. Use medicated pads and pain-relieving sprays and creams as directed. ? If you have an episiotomy or vaginal tear, check the area every day for signs of infection. Check for:  Redness, swelling, or pain.  Fluid or blood.  Warmth.  Pus or a bad smell. ? You may be given a squirt bottle to use instead of wiping to clean the perineum area after you go to the bathroom. As you start healing, you may use  the squirt bottle before wiping yourself. Make sure to wipe gently. ? To relieve pain caused by an episiotomy, vaginal tear, or hemorrhoids, try taking a warm sitz bath 2-3 times a day. A sitz bath is a warm water bath that is taken while you are sitting down. The water should only come up to your hips and should cover your buttocks. Breast care  Within the first few days after delivery, your breasts may feel heavy, full, and uncomfortable (breast engorgement). You may also have milk leaking from your breasts. Your health care provider can suggest ways to help relieve breast discomfort. Breast engorgement should go away within a few days.  If you are breastfeeding: ? Wear a bra that supports your breasts and fits you well. ? Keep your nipples clean and dry. Apply creams and ointments as told by your health care provider. ? You may need to use breast pads to absorb milk leakage. ? You may have uterine contractions every time you breastfeed for several weeks after delivery. Uterine contractions help your uterus return to its normal size. ? If you have any problems with breastfeeding, work with your health care provider or a lactation consultant.  If you are not breastfeeding: ? Avoid touching your breasts as this can make your breasts produce more milk. ? Wear a well-fitting bra and use cold packs to help with swelling. ? Do not squeeze out (express) milk. This causes you to make more milk. Intimacy and sexuality  Ask your health care provider when you can engage in sexual activity. This may depend on your: ? Risk of infection. ? Healing rate. ? Comfort and desire to engage in sexual activity.  You are able to get pregnant after delivery, even if you have not had your period. If desired, talk with your health care provider about methods of family planning or birth control (contraception). Lifestyle  Do not use any products that contain nicotine or tobacco, such as cigarettes, e-cigarettes,  and chewing tobacco. If you need help quitting, ask your health care provider.  Do not drink alcohol, especially if you are breastfeeding. Eating and drinking   Drink enough fluid to keep your urine pale yellow.  Eat high-fiber foods every day. These may help prevent or relieve constipation. High-fiber foods include: ? Whole grain cereals and breads. ? Brown rice. ? Beans. ? Fresh fruits and vegetables.  Take your prenatal vitamins until your postpartum checkup or until your health care provider tells you it is okay to stop. General instructions  Keep all follow-up visits for you and your baby as told by your health care provider. Most women visit their health care provider for a postpartum checkup within the first 3-6 weeks after delivery. Contact a health care provider if you:  Feel unable to cope with the changes that a new baby brings to your life, and these feelings do not go away.  Feel unusually sad or worried.  Have breasts that are painful, hard, or turn red.  Have a fever.    Have trouble holding urine or keeping urine from leaking. °· Have little or no interest in activities you used to enjoy. °· Have not breastfed at all and you have not had a menstrual period for 12 weeks after delivery. °· Have stopped breastfeeding and you have not had a menstrual period for 12 weeks after you stopped breastfeeding. °· Have questions about caring for yourself or your baby. °· Pass a blood clot from your vagina. °Get help right away if you: °· Have chest pain. °· Have difficulty breathing. °· Have sudden, severe leg pain. °· Have severe pain or cramping in your abdomen. °· Bleed from your vagina so much that you fill more than one sanitary pad in one hour. Bleeding should not be heavier than your heaviest period. °· Develop a severe headache. °· Faint. °· Have blurred vision or spots in your vision. °· Have a bad-smelling vaginal discharge. °· Have thoughts about hurting yourself or your  baby. °If you ever feel like you may hurt yourself or others, or have thoughts about taking your own life, get help right away. You can go to your nearest emergency department or call: °· Your local emergency services (911 in the [place] °· A suicide crisis helpline, such as the National Suicide Prevention Lifeline at 1-800-273-8255. This is open 24 hours a day. °Summary °· The period of time from when you deliver your baby to up to 6-12 weeks after delivery is called the postpartum period. °· Gradually return to your normal activities as told by your health care provider. °· Keep all follow-up visits for you and your baby as told by your health care provider. °This information is not intended to replace advice given to you by your health care provider. Make sure you discuss any questions you have with your health care provider. °Document Revised: 05/28/2018 Document Reviewed: 05/28/2018 °Elsevier Patient Education © 2020 Elsevier Inc[employer] ° ° °Postpartum Tubal Ligation, Care After °This sheet gives you information about how to care for yourself after your procedure. Your health care provider may also give you more specific instructions. If you have problems or questions, contact your health care provider. °What can I expect after the procedure? °After the procedure, you may have: °· A sore throat. °· Bruising or pain in your back. °· Nausea or vomiting. °· Dizziness. °· Mild abdominal discomfort or pain, such as cramping, gas pain, or feeling bloated. °· Soreness around the incision area. °· Tiredness. °· Pain in your shoulders. °Follow these instructions at home: °Medicines °· Ask your health care provider if the medicine prescribed to you: °? Requires you to avoid driving or using heavy machinery. °? Can cause constipation. You may need to take actions to prevent or treat constipation, such as: °§ Drink enough fluid to keep your urine pale yellow. °§ Take over-the-counter or prescription medicines. °§ Eat foods that  are high in fiber, such as beans, whole grains, and fresh fruits and vegetables. °§ Limit foods that are high in fat and processed sugars, such as fried or sweet foods. °· Do not take aspirin because it can cause bleeding. °Activity °· Rest for the remainder of the day. °· Return to your normal activities as told by your health care provider. Ask your health care provider what activities are safe for you. °· Do not have sex, douche, or put a tampon or anything else in your vagina for 6 weeks or as long as told by your health care provider. °· Do not lift anything that   that is heavier than your baby for 2 weeks, or the limit that you are told, until your health care provider says that it is safe. Incision care      Follow instructions from your health care provider about how to take care of your incision. Make sure you: ? Wash your hands with soap and water before and after you change your bandage (dressing). If soap and water are not available, use hand sanitizer. ? Change your dressing as told by your health care provider. ? Leave stitches (sutures), skin glue, or adhesive strips in place. These skin closures may need to stay in place for 2 weeks or longer. If adhesive strip edges start to loosen and curl up, you may trim the loose edges. Do not remove adhesive strips completely unless your health care provider tells you to do that.  Check your incision area every day for signs of infection. Check for: ? Redness, swelling, or pain. ? Fluid or blood. ? Warmth. ? Pus or a bad smell. Other Instructions  Do not take baths, swim, or use a hot tub until your health care provider approves. Ask your health care provider if you may take showers. You may only be allowed to take sponge baths.  Keep all follow-up visits as told by your health care provider. This is important. Contact a health care provider if:  You have redness, swelling, or pain around your incision.  Your incision feels warm to the  touch.  Your pain does not improve after 2-3 days.  You have a rash.  You repeatedly become dizzy or lightheaded.  Your pain medicine is not helping.  You are constipated. Get help right away if you:  Have a fever.  Faint.  Have pain in your abdomen that gets worse.  Have fluid or blood coming from your incision.  You have pus or a bad smell coming from your incision.  The edges of your incision break open after the sutures have been removed.  Have shortness of breath or trouble breathing.  Have chest pain or leg pain.  Have ongoing nausea or diarrhea. Summary  Mild abdominal discomfort is common after this procedure.  Contact your health care provider if you experience problems or have concerns.  Do not lift anything that is heavier than your baby for 2 weeks, or the limit that you are told, until your health care provider says that it is safe.  Keep all follow-up visits as told by your health care provider. This is important. This information is not intended to replace advice given to you by your health care provider. Make sure you discuss any questions you have with your health care provider. Document Revised: 03/23/2019 Document Reviewed: 08/28/2018 Elsevier Patient Education  2020 [employer].

## 2020-08-31 NOTE — Progress Notes (Signed)
POSTPARTUM PROGRESS NOTE  Post Operative Day 2  Subjective:  Mary Campos is a 34 y.o. [id number] s/p rLTCS/BTL at [redacted]w[redacted]d.  No acute events overnight.  Pt denies problems with ambulating, voiding or po intake.  She denies nausea or vomiting.  Pain is moderately controlled.  She has had flatus. She has had bowel movement.  Lochia Minimal.   History of anxiety and asthma. Patient's mood is good this morning, no anxiety or depressive symptoms. No shortness of breath this AM as well.   Objective: Blood pressure (!) 92/41, pulse (!) 50, temperature 98.4 F (36.9 C), temperature source Oral, resp. rate 18, height 5\' 2"  (1.575 m), weight 135.6 kg, last menstrual period 11/29/2019, SpO2 100 %, currently breastfeeding.  Physical Exam:  General: alert, cooperative and no distress Chest: no respiratory distress Heart:regular rate, distal pulses intact Abdomen: soft, nontender,  Uterine Fundus: firm, appropriately tender DVT Evaluation: No calf swelling or tenderness Extremities: No edema Skin: warm, dry; incision clean/dry/intact  Recent Labs    08/30/20 0536  HGB 10.3*  HCT 30.4*    Assessment/Plan: Mary Campos is a 34 y.o. 32 s/p rLTCS/BTL at [redacted]w[redacted]d. Patient would like to go home today. Despite her blood pressure and pulse is lower than her baseline, patient denies orthostatic symptom. No shortness of breath, or dizziness.   POD# 2- Doing well Contraception: BTL Feeding: breast Dispo: Plan for discharge today   LOS: 2 days   [redacted]w[redacted]d, Medical Student, 08/31/2020, 7:25 AM

## 2020-09-05 ENCOUNTER — Encounter: Payer: Self-pay | Admitting: *Deleted

## 2020-09-05 ENCOUNTER — Ambulatory Visit (INDEPENDENT_AMBULATORY_CARE_PROVIDER_SITE_OTHER): Payer: BC Managed Care – PPO | Admitting: *Deleted

## 2020-09-05 ENCOUNTER — Other Ambulatory Visit: Payer: Self-pay

## 2020-09-05 VITALS — BP 130/70 | HR 71 | Wt 292.0 lb

## 2020-09-05 DIAGNOSIS — Z98891 History of uterine scar from previous surgery: Secondary | ICD-10-CM

## 2020-09-05 NOTE — Progress Notes (Signed)
Subjective:  Mary Campos is a 34 y.o. female here for BP check and incision check status post cesarean section on 08/29/2020.   Hypertension ROS: noting swelling of ankles. Incision healing well. Prevena removed. No redness and drainage noted.     Objective:  BP 130/70   Pulse 71   Wt 292 lb (132.5 kg)   BMI 53.41 kg/m   Appearance alert, well appearing, and in no distress. General exam BP noted to be well controlled today in office.    Assessment:   Blood Pressure stable.   Plan:  Follow up as needed and at postpartum visit.

## 2020-09-29 ENCOUNTER — Ambulatory Visit (INDEPENDENT_AMBULATORY_CARE_PROVIDER_SITE_OTHER): Payer: BC Managed Care – PPO | Admitting: Obstetrics and Gynecology

## 2020-09-29 ENCOUNTER — Other Ambulatory Visit: Payer: Self-pay

## 2020-09-29 VITALS — BP 136/85 | HR 68 | Wt 283.0 lb

## 2020-09-29 DIAGNOSIS — Z98891 History of uterine scar from previous surgery: Secondary | ICD-10-CM

## 2020-09-29 DIAGNOSIS — F53 Postpartum depression: Secondary | ICD-10-CM | POA: Insufficient documentation

## 2020-09-29 DIAGNOSIS — Z9851 Tubal ligation status: Secondary | ICD-10-CM

## 2020-09-29 DIAGNOSIS — O99345 Other mental disorders complicating the puerperium: Secondary | ICD-10-CM

## 2020-09-29 MED ORDER — MONTELUKAST SODIUM 10 MG PO TABS: 10.0000 mg | ORAL_TABLET | Freq: Every evening | ORAL | 2 refills | Status: AC | PRN

## 2020-09-29 MED ORDER — BREAST PUMP MISC: 0 refills | Status: DC

## 2020-09-29 NOTE — Progress Notes (Signed)
      Post Partum Visit Note  Rocky Rishel is a 34 y.o. [id number] who presents for a postpartum visit. She is s/p a scheduled 11/8 rLTCS and BTL.   I have fully reviewed the prenatal and intrapartum course. The delivery was at 39.1 gestational weeks.  Anesthesia: spinal. Postpartum course has been uncomplicated. Baby is doing well. Baby is feeding by both breast and bottle - enfamil premium. Bleeding none. Bowel function is normal. Bladder function is normal. Patient is not sexually active. Contraception method is tubal ligation. Postpartum depression screening: positive.     Edinburgh Postnatal Depression Scale - 09/29/20 1330      Edinburgh Postnatal Depression Scale:  In the Past 7 Days   I have been able to laugh and see the funny side of things. 1    I have looked forward with enjoyment to things. 1    I have blamed myself unnecessarily when things went wrong. 1    I have been anxious or worried for no good reason. 3    I have felt scared or panicky for no good reason. 3    Things have been getting on top of me. 3    I have been so unhappy that I have had difficulty sleeping. 2    I have felt sad or miserable. 2    I have been so unhappy that I have been crying. 3    The thought of harming myself has occurred to me. 0    Edinburgh Postnatal Depression Scale Total 19            Review of Systems Pertinent items are noted in HPI.    Objective:  BP 136/85   Pulse 68   Wt 283 lb (128.4 kg)   BMI 51.76 kg/m    NAD Abdomen: soft, nttp, nd. C/d/i incision, well healed.   Assessment:   Pt stable.   Plan:  *Postpartum: routine care. Well healing. Repeat pap smear and annual exam early 2022 *Anxiety/Depression: patient has good help and support at home and with closeby family but it's still a hard transition to her fifth child. Pt also having issues getting enough rest and during the day. She was on an SSRI during her last pregnancy and PP; she wasn't on anything  this pregnancy and would like to try and avoid any use. I brought up with her re: talking to [facility], but also if she feels like she needs to go back on an SSRI that many times it is temporary. I also d/w her to not be so reluctant to get help from friends and family and to make sure she is having enough time to herself.   Pt to consider options and let us know how she'd like to proceed.   RTC: 10-14 day mood check. 40m annual exam.   Hayden Bing, MD Center for [facility], [facility]

## 2020-10-13 ENCOUNTER — Telehealth: Payer: BC Managed Care – PPO | Admitting: Obstetrics & Gynecology

## 2020-10-18 ENCOUNTER — Encounter: Payer: Self-pay | Admitting: Obstetrics & Gynecology

## 2020-10-18 ENCOUNTER — Telehealth (INDEPENDENT_AMBULATORY_CARE_PROVIDER_SITE_OTHER): Payer: BC Managed Care – PPO | Admitting: Obstetrics & Gynecology

## 2020-10-18 DIAGNOSIS — F411 Generalized anxiety disorder: Secondary | ICD-10-CM | POA: Diagnosis not present

## 2020-10-18 DIAGNOSIS — O99345 Other mental disorders complicating the puerperium: Secondary | ICD-10-CM

## 2020-10-18 DIAGNOSIS — R87612 Low grade squamous intraepithelial lesion on cytologic smear of cervix (LGSIL): Secondary | ICD-10-CM

## 2020-10-18 DIAGNOSIS — F53 Postpartum depression: Secondary | ICD-10-CM | POA: Diagnosis not present

## 2020-10-18 MED ORDER — BUPROPION HCL ER (XL) 150 MG PO TB24: 150.0000 mg | ORAL_TABLET | Freq: Every day | ORAL | 1 refills | Status: DC

## 2020-10-18 NOTE — Patient Instructions (Addendum)
Wellbutrin (Bupropion) Cymbalta (Duloxetine)  Good antidepressants not associated with weight gain.  Talk to [facility] Clinician about counseling and referral to mental health provider   Persistent Depressive Disorder, Adult Persistent depressive disorder (PDD) is a mental health condition that causes symptoms of low-level depression for 2 years or longer. It may also be called long-term (chronic) depression or dysthymia. PDD may include episodes of more severe depression that last for about 2 weeks (major depressive disorder or MDD). PDD can affect the way you think, feel, and sleep. This condition may also affect your relationships. You may be more likely to get sick if you have PDD. What are the causes? The exact cause of this condition is not known. PDD is most likely caused by a combination of things, which may include:  Genetic factors. These are traits that are passed along from parent to child.  Individual factors. Your personality, your behavior, and the way you handle your thoughts and feelings may contribute to PDD. This includes personality traits and behaviors learned from others.  Physical factors, such as: ? Differences in the part of your brain that controls emotion. This part of your brain may be different than it is in people who do not have PDD. ? Long-term (chronic) medical or psychiatric illnesses.  Social factors. Traumatic experiences or major life changes may play a role in the development of PDD. What increases the risk? This condition is more likely to develop in women. The following factors may make you more likely to develop PDD:  A family history of depression.  Abnormally low levels of certain brain chemicals.  Traumatic events in childhood, especially abuse or the loss of a parent.  Being under a lot of stress, or long-term stress, especially from upsetting life experiences or losses.  A history of: ? Chronic physical illness. ? Other mental  health disorders. ? Substance abuse.  Poor living conditions.  Experiencing social exclusion or discrimination on a regular basis. What are the signs or symptoms? Symptoms of this condition occur for most of the day, and may include:  Fatigue or low energy.  Eating too much or too little.  Sleeping too much or too little.  Restlessness or agitation.  Feelings of hopelessness.  Feeling worthless or guilty.  Anxiety.  Poor concentration or difficulty making decisions.  Low self-esteem.  Negative outlook.  Inability to have fun or experience pleasure.  Social withdrawal.  Unexplained physical complaints.  Irritability.  Aggressive behavior or anger. How is this diagnosed? This condition may be diagnosed based on:  Your symptoms.  Your medical history, including your mental health history. This may involve tests to evaluate your mental health. You may be asked questions about your lifestyle, including any drug and alcohol use, and how long you have had symptoms of PDD.  A physical exam.  Blood tests to rule out other conditions. You may be diagnosed with PDD if you have had a depressed mood for 2 years or longer, as well as other symptoms of depression. How is this treated? This condition is usually treated by mental health professionals, such as psychologists, psychiatrists, and clinical social workers. You may need more than one type of treatment. Treatment may include:  Psychotherapy. This is also called talk therapy or counseling. Types of psychotherapy include: ? Cognitive behavioral therapy (CBT). This type of therapy teaches you to recognize unhealthy feelings, thoughts, and behaviors, and replace them with positive thoughts and actions. ? Interpersonal therapy (IPT). This helps you to improve the  way you relate to and communicate with others. ? Family therapy. This treatment includes members of your family.  Medicine to treat anxiety and depression, or to  help you control certain emotions and behaviors.  Lifestyle changes, such as: ? Limiting alcohol and drug use. ? Exercising regularly. ? Getting plenty of sleep. ? Making healthy eating choices. ? Spending more time outdoors.  Follow these instructions at home: Activity  Return to your normal activities as told by your health care provider.  Exercise regularly and spend time outdoors as told by your health care provider. General instructions  Take over-the-counter and prescription medicines only as told by your health care provider.  Do not drink alcohol. If you drink alcohol, limit your alcohol intake to no more than 1 drink a day for nonpregnant women and 2 drinks a day for men. One drink equals 12 oz of beer, 5 oz of wine, or 1 oz of hard liquor. Alcohol can affect any antidepressant medicines you are taking. Talk to your health care provider about your alcohol use.  Eat a healthy diet and get plenty of sleep.  Find activities that you enjoy doing, and make time to do them.  Consider joining a support group. Your health care provider may be able to recommend a support group.  Keep all follow-up visits as told by your health care provider. This is important. Where to find more information [employer] on Mental Illness  www.nami.org [employer] of Mental Health  http://www.maynard.net/ National Suicide Prevention Lifeline  1-800-273-TALK 706 520 7800). This is free, 24-hour help. Contact a health care provider if:  Your symptoms get worse.  You develop new symptoms.  You have trouble sleeping or doing your daily activities. Get help right away if:  You self-harm.  You have serious thoughts about hurting yourself or others.  You see, hear, taste, smell, or feel things that are not present (hallucinate). This information is not intended to replace advice given to you by your health care provider. Make sure you discuss any questions you have with your health  care provider. Document Revised: 09/20/2017 Document Reviewed: 04/21/2016 Elsevier Patient Education  2020 [employer].

## 2020-10-18 NOTE — Addendum Note (Signed)
Addended by: Jaynie Collins A on: 10/18/2020 01:15 PM   Modules accepted: Orders

## 2020-10-18 NOTE — Progress Notes (Signed)
GYNECOLOGY VIRTUAL VISIT ENCOUNTER NOTE  Provider location: [facility]   I connected with Mary Campos on 10/18/20 at 11:30 AM EST by MyChart Video Encounter at home and verified that I am speaking with the correct person using two identifiers.   I discussed the limitations, risks, security and privacy concerns of performing an evaluation and management service virtually and the availability of in person appointments. I also discussed with the patient that there may be a patient responsible charge related to this service. The patient expressed understanding and agreed to proceed.   History:  Mary Campos is a 34 y.o. [id number] female being followed up today for postpartum depression and anxiety. Was seen on 09/29/2020, Edinburgh score was 19. Offered medication and referral to [facility] clinician, she declined at that time.  Today, she reports continued symptoms, score is 14. Denies HI/SI. She denies any other concerns.   Edinburgh Postnatal Depression Scale - 10/18/20 1136      Edinburgh Postnatal Depression Scale:  In the Past 7 Days   I have been able to laugh and see the funny side of things. 1    I have looked forward with enjoyment to things. 1    I have blamed myself unnecessarily when things went wrong. 2    I have been anxious or worried for no good reason. 3    I have felt scared or panicky for no good reason. 1    Things have been getting on top of me. 2    I have been so unhappy that I have had difficulty sleeping. 1    I have felt sad or miserable. 2    I have been so unhappy that I have been crying. 1    The thought of harming myself has occurred to me. 0    Edinburgh Postnatal Depression Scale Total 14               Past Medical History:  Diagnosis Date  . Asthma    albuteral inhaler PRN  . Depression    hx mild ppd  . History of pancreatitis   . Vaginal Pap smear, abnormal    Past Surgical History:  Procedure  Laterality Date  . CESAREAN SECTION     X 2  . CESAREAN SECTION N/A 04/11/2018   Procedure: REPEAT CESAREAN SECTION;  Surgeon: [place] Bing, MD;  Location: [facility] BIRTHING SUITES;  Service: Obstetrics;  Laterality: N/A;  . CESAREAN SECTION WITH BILATERAL TUBAL LIGATION Bilateral 07/03/2016   Procedure: CESAREAN SECTION/ Female 8lbs. 3oz;  Surgeon: Nadara Mustard, MD;  Location: [facility] ORS;  Service: Obstetrics;  Laterality: Bilateral;  . CESAREAN SECTION WITH BILATERAL TUBAL LIGATION N/A 08/29/2020   Procedure: CESAREAN SECTION WITH BILATERAL TUBAL LIGATION;  Surgeon: [place] Bing, MD;  Location: [facility] ORS;  Service: Obstetrics;  Laterality: N/A;  . CHOLECYSTECTOMY    . COLPOSCOPY W/ BIOPSY / CURETTAGE  11/12/2019      . TONSILLECTOMY     The following portions of the patient's history were reviewed and updated as appropriate: allergies, current medications, past family history, past medical history, past social history, past surgical history and problem list.   Health Maintenance:  LGSIL pap and negative HRHPV on 10/26/19, benign colposcopy on 11/12/19.    Review of Systems:  Pertinent items noted in HPI and remainder of comprehensive ROS otherwise negative.  Physical Exam:   General:  Alert, oriented and cooperative. Patient appears to be  in no acute distress.  Mental Status: Normal mood and affect. Normal behavior. Normal judgment and thought content.   Respiratory: Normal respiratory effort, no problems with respiration noted  Rest of physical exam deferred due to type of encounter   Edinburgh Postnatal Depression Scale Screening Tool 10/18/2020 09/29/2020 08/30/2020 05/09/2018 04/15/2018  I have been able to laugh and see the funny side of things. 1 1 0 0 0  I have looked forward with enjoyment to things. 1 1 0 0 0  I have blamed myself unnecessarily when things went wrong. 2 1 1 1  0  I have been anxious or worried for no good reason. 3 3 3 1  0  I have felt scared or panicky for no good  reason. 1 3 1 1  0  Things have been getting on top of me. 2 3 3 1 1   I have been so unhappy that I have had difficulty sleeping. 1 2 2 1  0  I have felt sad or miserable. 2 2 0 1 0  I have been so unhappy that I have been crying. 1 3 1 1  0  The thought of harming myself has occurred to me. 0 0 0 0 0  Edinburgh Postnatal Depression Scale Total 14 19 11 7 1        Assessment and Plan:     1. Postpartum depression 2. Anxiety state Recommended [facility] clinician referral. Patient verbally consented to [facility] services about presenting concerns and psychiatric consultation as appropriate. - Amb ref to [facility] Discussed medication therapy, she does not want SSRI due to them not workign well and also causing weight gain. Told her to research Bupropion and Duloxetine, these are not usually associated with weight gain and also treat her concerns.  She will discuss with [facility] clinician or mental health provider.  SI/HI precautions reviewed with patient.  3. LGSIL on Pap smear of cervix with negative HPV on 10/26/2019 Will be scheduled for repeat cotesting next month, patient is aware of need for surveillance.     I discussed the assessment and treatment plan with the patient. The patient was provided an opportunity to ask questions and all were answered. The patient agreed with the plan and demonstrated an understanding of the instructions.   The patient was advised to call back or seek an in-person evaluation/go to the ED if the symptoms worsen or if the condition fails to improve as anticipated.  I provided 15 minutes of face-to-face time during this encounter.   , MD [facility] , [facility]

## 2020-10-18 NOTE — Progress Notes (Signed)
Addendum: Patient called a few hours after encounter, wants to be started on Wellbutrin and follow up with [facility] clinician. Wellbutrin XL 150 mg daily prescribed, she will follow up as scheduled.  Jaynie Collins, MD

## 2020-11-08 ENCOUNTER — Other Ambulatory Visit (HOSPITAL_COMMUNITY)
Admission: RE | Admit: 2020-11-08 | Discharge: 2020-11-08 | Disposition: A | Discharge status: Home / Self Care | Payer: BC Managed Care – PPO | Source: Ambulatory Visit | Attending: Obstetrics & Gynecology | Admitting: Obstetrics & Gynecology

## 2020-11-08 ENCOUNTER — Ambulatory Visit (INDEPENDENT_AMBULATORY_CARE_PROVIDER_SITE_OTHER): Payer: BC Managed Care – PPO | Admitting: Obstetrics & Gynecology

## 2020-11-08 ENCOUNTER — Encounter: Payer: Self-pay | Admitting: Obstetrics & Gynecology

## 2020-11-08 ENCOUNTER — Other Ambulatory Visit: Payer: Self-pay

## 2020-11-08 VITALS — BP 121/81 | HR 71 | Wt 287.0 lb

## 2020-11-08 DIAGNOSIS — O99345 Other mental disorders complicating the puerperium: Secondary | ICD-10-CM

## 2020-11-08 DIAGNOSIS — R87612 Low grade squamous intraepithelial lesion on cytologic smear of cervix (LGSIL): Secondary | ICD-10-CM

## 2020-11-08 DIAGNOSIS — F53 Postpartum depression: Secondary | ICD-10-CM | POA: Diagnosis not present

## 2020-11-08 NOTE — Progress Notes (Signed)
GYNECOLOGY OFFICE VISIT NOTE  History:   Mary Campos is a 35 y.o. [id number] here today for follow up pap smear as recommended given LGSIL on Pap smear of cervix with negative HPV in 10/26/2019. Also for follow up of postpartum depression. Was prescribed Wellbutrin, she had very bad heartburn and self-discontinued this after two weeks. Never heard from [facility] clinicians that she was referred to. Overall feels much better though, given increased sleep for her baby lately.  She denies any abnormal vaginal discharge, bleeding, pelvic pain or other concerns.    Past Medical History:  Diagnosis Date  . Asthma    albuteral inhaler PRN  . Depression    hx mild ppd  . History of pancreatitis   . Vaginal Pap smear, abnormal     Past Surgical History:  Procedure Laterality Date  . CESAREAN SECTION     X 2  . CESAREAN SECTION N/A 04/11/2018   Procedure: REPEAT CESAREAN SECTION;  Surgeon: [place] Bing, MD;  Location: [facility] BIRTHING SUITES;  Service: Obstetrics;  Laterality: N/A;  . CESAREAN SECTION WITH BILATERAL TUBAL LIGATION Bilateral 07/03/2016   Procedure: CESAREAN SECTION/ Female 8lbs. 3oz;  Surgeon: Nadara Mustard, MD;  Location: [facility] ORS;  Service: Obstetrics;  Laterality: Bilateral;  . CESAREAN SECTION WITH BILATERAL TUBAL LIGATION N/A 08/29/2020   Procedure: CESAREAN SECTION WITH BILATERAL TUBAL LIGATION;  Surgeon: [place] Bing, MD;  Location: [facility];  Service: Obstetrics;  Laterality: N/A;  . CHOLECYSTECTOMY    . COLPOSCOPY W/ BIOPSY / CURETTAGE  11/12/2019      . TONSILLECTOMY     The following portions of the patient's history were reviewed and updated as appropriate: allergies, current medications, past family history, past medical history, past social history, past surgical history and problem list.    Review of Systems:  Pertinent items noted in HPI and remainder of comprehensive ROS otherwise negative.  Physical Exam:  BP 121/81   Pulse 71   Wt 287 lb (130.2 kg)    BMI 52.49 kg/m  CONSTITUTIONAL: Well-developed, well-nourished female in no acute distress.  NECK: Normal range of motion, supple, no masses.  Normal thyroid.  SKIN: Skin is warm and dry. No rash noted. Not diaphoretic. No erythema. No pallor. MUSCULOSKELETAL: Normal range of motion. No tenderness.  No cyanosis, clubbing, or edema.  2+ distal pulses. NEUROLOGIC: Alert and oriented to person, place, and time. Normal reflexes, muscle tone coordination.  PSYCHIATRIC: Normal mood and affect. Normal behavior. Normal judgment and thought content. CARDIOVASCULAR: Normal heart rate noted RESPIRATORY:  Effort and breath sounds normal, no problems with respiration noted. ABDOMEN: Soft, no distention noted. PELVIC: Normal appearing external genitalia and urethral meatus; normal appearing vaginal mucosa and cervix.  No abnormal discharge noted.  Pap smear obtained.  Performed in the presence of a chaperone.     Assessment and Plan:      1. Postpartum depression She reports feeling better overall, now that she is sleeping. No need for antidepressant for now. Offered repeat [facility] clinician referral, she declined for now. Will continue to monitor symptoms.   2. LGSIL on Pap smear of cervix with negative HPV on 10/26/2019 - Cytology - PAP done today, will follow up results and manage accordingly.  Routine preventative health maintenance measures emphasized. Please refer to After Visit Summary for other counseling recommendations.   Return for any gynecologic concerns.    I spent 15 minutes dedicated to the care of this patient including pre-visit review of records, face to  face time with the patient discussing her conditions and treatments and post visit ordering of testing.    Jaynie Collins, MD, FACOG Obstetrician & Gynecologist, [facility] for [employer], [facility]

## 2020-11-11 LAB — CYTOLOGY - PAP
Adequacy: ABSENT
Comment: NEGATIVE
Diagnosis: NEGATIVE
High risk HPV: NEGATIVE

## 2021-01-10 DIAGNOSIS — J302 Other seasonal allergic rhinitis: Secondary | ICD-10-CM | POA: Diagnosis not present

## 2021-01-10 DIAGNOSIS — R635 Abnormal weight gain: Secondary | ICD-10-CM | POA: Diagnosis not present

## 2021-01-10 DIAGNOSIS — Z79899 Other long term (current) drug therapy: Secondary | ICD-10-CM | POA: Diagnosis not present

## 2021-01-10 DIAGNOSIS — O99345 Other mental disorders complicating the puerperium: Secondary | ICD-10-CM | POA: Diagnosis not present

## 2021-01-10 DIAGNOSIS — E559 Vitamin D deficiency, unspecified: Secondary | ICD-10-CM | POA: Diagnosis not present

## 2021-01-10 DIAGNOSIS — R062 Wheezing: Secondary | ICD-10-CM | POA: Diagnosis not present

## 2021-01-10 DIAGNOSIS — F53 Postpartum depression: Secondary | ICD-10-CM | POA: Diagnosis not present

## 2021-04-05 DIAGNOSIS — Z6841 Body Mass Index (BMI) 40.0 and over, adult: Secondary | ICD-10-CM | POA: Diagnosis not present

## 2021-04-05 DIAGNOSIS — E559 Vitamin D deficiency, unspecified: Secondary | ICD-10-CM | POA: Diagnosis not present

## 2021-04-05 DIAGNOSIS — F4322 Adjustment disorder with anxiety: Secondary | ICD-10-CM | POA: Diagnosis not present

## 2021-06-01 DIAGNOSIS — G43909 Migraine, unspecified, not intractable, without status migrainosus: Secondary | ICD-10-CM | POA: Diagnosis not present

## 2021-06-01 DIAGNOSIS — F4322 Adjustment disorder with anxiety: Secondary | ICD-10-CM | POA: Diagnosis not present

## 2021-06-01 DIAGNOSIS — J302 Other seasonal allergic rhinitis: Secondary | ICD-10-CM | POA: Diagnosis not present

## 2021-06-01 DIAGNOSIS — Z6841 Body Mass Index (BMI) 40.0 and over, adult: Secondary | ICD-10-CM | POA: Diagnosis not present

## 2021-06-07 DIAGNOSIS — D225 Melanocytic nevi of trunk: Secondary | ICD-10-CM | POA: Diagnosis not present

## 2021-06-07 DIAGNOSIS — D2239 Melanocytic nevi of other parts of face: Secondary | ICD-10-CM | POA: Diagnosis not present

## 2021-06-07 DIAGNOSIS — L918 Other hypertrophic disorders of the skin: Secondary | ICD-10-CM | POA: Diagnosis not present

## 2021-06-07 DIAGNOSIS — L821 Other seborrheic keratosis: Secondary | ICD-10-CM | POA: Diagnosis not present

## 2021-06-07 DIAGNOSIS — D485 Neoplasm of uncertain behavior of skin: Secondary | ICD-10-CM | POA: Diagnosis not present

## 2021-06-20 DIAGNOSIS — D0439 Carcinoma in situ of skin of other parts of face: Secondary | ICD-10-CM | POA: Diagnosis not present

## 2021-08-25 DIAGNOSIS — L710 Perioral dermatitis: Secondary | ICD-10-CM | POA: Diagnosis not present

## 2021-08-25 DIAGNOSIS — D0439 Carcinoma in situ of skin of other parts of face: Secondary | ICD-10-CM | POA: Diagnosis not present

## 2021-09-01 DIAGNOSIS — F4322 Adjustment disorder with anxiety: Secondary | ICD-10-CM | POA: Diagnosis not present

## 2021-09-01 DIAGNOSIS — Z6841 Body Mass Index (BMI) 40.0 and over, adult: Secondary | ICD-10-CM | POA: Diagnosis not present

## 2021-09-01 DIAGNOSIS — R635 Abnormal weight gain: Secondary | ICD-10-CM | POA: Diagnosis not present

## 2021-09-01 DIAGNOSIS — Z23 Encounter for immunization: Secondary | ICD-10-CM | POA: Diagnosis not present

## 2022-03-08 ENCOUNTER — Emergency Department
Admission: EM | Admit: 2022-03-08 | Discharge: 2022-03-09 | Disposition: A | Discharge status: Home / Self Care | Payer: BC Managed Care – PPO | Attending: Emergency Medicine | Admitting: Emergency Medicine

## 2022-03-08 ENCOUNTER — Emergency Department: Payer: BC Managed Care – PPO

## 2022-03-08 ENCOUNTER — Other Ambulatory Visit: Payer: Self-pay

## 2022-03-08 DIAGNOSIS — J45909 Unspecified asthma, uncomplicated: Secondary | ICD-10-CM | POA: Insufficient documentation

## 2022-03-08 DIAGNOSIS — R5383 Other fatigue: Secondary | ICD-10-CM | POA: Diagnosis not present

## 2022-03-08 DIAGNOSIS — R001 Bradycardia, unspecified: Secondary | ICD-10-CM | POA: Insufficient documentation

## 2022-03-08 DIAGNOSIS — R079 Chest pain, unspecified: Secondary | ICD-10-CM | POA: Diagnosis not present

## 2022-03-08 LAB — COMPREHENSIVE METABOLIC PANEL
ALT: 20 U/L (ref 0–44)
AST: 17 U/L (ref 15–41)
Albumin: 4.3 g/dL (ref 3.5–5.0)
Alkaline Phosphatase: 109 U/L (ref 38–126)
Anion gap: 9 (ref 5–15)
BUN: 20 mg/dL (ref 6–20)
CO2: 27 mmol/L (ref 22–32)
Calcium: 9.3 mg/dL (ref 8.9–10.3)
Chloride: 105 mmol/L (ref 98–111)
Creatinine, Ser: 0.92 mg/dL (ref 0.44–1.00)
GFR, Estimated: >60 mL/min (ref >60)
Glucose, Bld: 94 mg/dL (ref 70–99)
Potassium: 3.3 mmol/L — ABNORMAL LOW (ref 3.5–5.1)
Sodium: 141 mmol/L (ref 135–145)
Total Bilirubin: 1.1 mg/dL (ref 0.3–1.2)
Total Protein: 7.2 g/dL (ref 6.5–8.1)

## 2022-03-08 LAB — POC URINE PREG, ED: Preg Test, Ur: NEGATIVE

## 2022-03-08 LAB — CBC
HCT: 41.3 % (ref 36.0–46.0)
Hemoglobin: 13.3 g/dL (ref 12.0–15.0)
MCH: 28.1 pg (ref 26.0–34.0)
MCHC: 32.2 g/dL (ref 30.0–36.0)
MCV: 87.3 fL (ref 80.0–100.0)
Platelets: 353 10*3/uL (ref 150–400)
RBC: 4.73 MIL/uL (ref 3.87–5.11)
RDW: 13.2 % (ref 11.5–15.5)
WBC: 9.5 10*3/uL (ref 4.0–10.5)
nRBC: 0 % (ref 0.0–0.2)

## 2022-03-08 LAB — TROPONIN I (HIGH SENSITIVITY): Troponin I (High Sensitivity): 3 ng/L (ref <18)

## 2022-03-08 NOTE — ED Provider Notes (Signed)
[facility] Provider Note    Event Date/Time   First MD Initiated Contact with Patient 03/08/22 2321     (approximate)  History   Chief Complaint: Bradycardia and Fatigue  HPI  Mary Campos is a 36 y.o. female with a past medical history of asthma, depression, presents to the emergency department for low heart rate.  According to the patient over the past 2 years she has intermittently been getting notifications on her watch about a low heart rate less than 50 bpm for 10 minutes, per patient, per her watch.  Patient states today she got several notifications in fact she got 5 different notification saying that her heart rate was low.  Patient states she was feeling somewhat more fatigued today but denies any chest pain shortness of breath or nausea.  Patient states she has seen her doctor for the same and they have said that her heart rate was okay during her visit but she is never worn a Holter monitor has never seen a heart doctor.  Here the patient states she feels well currently, vitals are reassuring pulse rate around 70 bpm appears to be normal sinus rhythm on the cardiac monitor.  Physical Exam   Triage Vital Signs: ED Triage Vitals  Enc Vitals Group     BP 03/08/22 2111 128/82     Pulse Rate 03/08/22 2111 72     Resp 03/08/22 2111 16     Temp 03/08/22 2111 98.6 F (37 C)     Temp Source 03/08/22 2111 Oral     SpO2 03/08/22 2111 100 %     Weight 03/08/22 2109 295 lb (133.8 kg)     Height 03/08/22 2109 5\' 2"  (1.575 m)     Head Circumference --      Peak Flow --      Pain Score 03/08/22 2109 6     Pain Loc --      Pain Edu? --      Excl. in GC? --     Most recent vital signs: Vitals:   03/08/22 2332 03/08/22 2342  BP:  116/77  Pulse: 64 71  Resp: 17 20  Temp:    SpO2: 100% 100%    General: Awake, no distress.  CV:  Good peripheral perfusion.  Regular rate and rhythm  Resp:  Normal effort.  Equal breath sounds bilaterally.   Abd:  No distention.  Soft, nontender.  No rebound or guarding.    ED Results / Procedures / Treatments   EKG  EKG viewed and interpreted by myself shows a normal sinus rhythm at 66 bpm with a narrow QRS, normal axis, normal intervals, no concerning ST changes.  RADIOLOGY  I have interpreted the x-ray images no acute finding on my evaluation. Radiology is read the chest x-ray is negative   MEDICATIONS ORDERED IN ED: Medications - No data to display   IMPRESSION / MDM / ASSESSMENT AND PLAN / ED COURSE  I reviewed the triage vital signs and the nursing notes.  Patient presents to the emergency department for low heart rate.  Patient states 5 episodes of low heart rate over the past 24 hours.  Patient showed me the alerts on the watch patient's heart rate ranging between 40 and 50 bpm.  Currently around 70 bpm in sinus.  Patient's work-up today is overall reassuring.  Lab work largely within normal limits with a normal CBC, normal chemistry, negative cardiac enzyme.  Chest x-ray is clear and  EKG is reassuring.  I have added on a TSH as a precaution.  If TSH is normal we will discharge with cardiology follow-up for consideration of a Holter monitor.  Patient is agreeable to this plan of care.  TSH is normal.  We will discharge with cardiology follow-up.  Patient agreeable to plan of care. FINAL CLINICAL IMPRESSION(S) / ED DIAGNOSES   Bradycardia  Note:  This document was prepared using Dragon voice recognition software and may include unintentional dictation errors.   Minna Antis, MD 03/09/22 [phone]

## 2022-03-08 NOTE — ED Triage Notes (Addendum)
Pt presents to ER c/o feeling fatigued today.  Pt states she has got several notifications from her watch telling her she has a low HR between 40-50.  Pt denies prior hx pf bradycardia, denies prior heart problems.  Endorses some chest pain, dizziness and sob.  Pt A&O x4 at this time in NAD.  No beta blocker use per pt.

## 2022-03-09 LAB — TSH: TSH: 2.126 u[IU]/mL (ref 0.350–4.500)

## 2022-03-09 NOTE — ED Notes (Signed)
Discharge instructions discussed with pt. Pt verbalized understanding with no questions at this time. Pt to follow up with cardiology 

## 2022-03-09 NOTE — Discharge Instructions (Signed)
Please call the number provided for cardiology to arrange a follow-up appointment for further evaluation and consideration of a possible Holter monitor.  Return to the emergency department for any symptom personally concerning to yourself.

## 2022-04-25 DIAGNOSIS — R070 Pain in throat: Secondary | ICD-10-CM | POA: Diagnosis not present

## 2022-04-25 DIAGNOSIS — J069 Acute upper respiratory infection, unspecified: Secondary | ICD-10-CM | POA: Diagnosis not present

## 2022-05-01 ENCOUNTER — Ambulatory Visit (INDEPENDENT_AMBULATORY_CARE_PROVIDER_SITE_OTHER): Payer: BC Managed Care – PPO

## 2022-05-01 ENCOUNTER — Encounter: Payer: Self-pay | Admitting: Cardiovascular Disease

## 2022-05-01 ENCOUNTER — Ambulatory Visit: Payer: BC Managed Care – PPO | Admitting: Cardiovascular Disease

## 2022-05-01 VITALS — BP 135/81 | HR 73 | Ht 62.0 in | Wt 314.1 lb

## 2022-05-01 DIAGNOSIS — R001 Bradycardia, unspecified: Secondary | ICD-10-CM | POA: Diagnosis not present

## 2022-05-01 DIAGNOSIS — R0602 Shortness of breath: Secondary | ICD-10-CM

## 2022-05-01 DIAGNOSIS — R42 Dizziness and giddiness: Secondary | ICD-10-CM

## 2022-05-01 DIAGNOSIS — Z6841 Body Mass Index (BMI) 40.0 and over, adult: Secondary | ICD-10-CM | POA: Diagnosis not present

## 2022-05-01 NOTE — Progress Notes (Signed)
Cardiology Office Note   Date:  05/01/2022   ID:  [name], DOB [birth year], MRN [id number]  PCP:  Noni Saupe, MD  Cardiologist:   Lorine Bears, MD   Chief Complaint  Patient presents with   Other    Bradycardia/Fatigue c/o sob. Meds reviewed verbally with pt.      History of Present Illness: Mary Campos is a 36 y.o. female who was referred from [facility] ED for evaluation of fatigue and bradycardia. She has past medical history of asthma, depression and obesity.  She went to the emergency room in May due to intermittent episodes of bradycardia noted by her smart watch. Her labs were unremarkable except for hypokalemia with a potassium of 3.3.  TSH was normal. She reports intermittent episodes of bradycardia both at night and during the day with heart rate in the high 40s.  This is usually associated with mild dizziness and having no energy.  She never had syncope before.  She does report worsening exertional dyspnea and fatigue with rare episodes of chest pain. She is not aware of sleep apnea and does not know if she snores or not.  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 3oz;  Surgeon: Nadara Mustard, MD;  Location: [facility] ORS;  Service: Obstetrics;  Laterality: Bilateral;   CESAREAN SECTION WITH BILATERAL TUBAL LIGATION N/A 08/29/2020   Procedure: CESAREAN SECTION WITH BILATERAL TUBAL LIGATION;  Surgeon: Valentine Bing, MD;   Location: [facility];  Service: Obstetrics;  Laterality: N/A;   CHOLECYSTECTOMY     COLPOSCOPY W/ BIOPSY / CURETTAGE  11/12/2019       TONSILLECTOMY       Current Outpatient Medications  Medication Sig Dispense Refill   albuterol (VENTOLIN HFA) 108 (90 Base) MCG/ACT inhaler Inhale 2 puffs into the lungs every 4 (four) hours as needed for wheezing or shortness of breath. 18 g 2   citalopram (CELEXA) 10 MG tablet Take 10 mg by mouth daily.     ibuprofen (ADVIL) 800 MG tablet Take 1 tablet (800 mg total) by mouth every 8 (eight) hours. 30 tablet 0   loratadine (CLARITIN) 10 MG tablet Take 10 mg by mouth at bedtime as needed (allergies (alternates between allegra, singulair & claritin)).      montelukast (SINGULAIR) 10 MG tablet Take 1 tablet (10 mg total) by mouth at bedtime as needed. 30 tablet 2   No current facility-administered medications for this visit.    Allergies:   Azithromycin, Eletriptan hydrobromide, Pseudoephedrine, and Tape    Social History:  The patient  reports that she has never smoked. She has never used smokeless tobacco. She reports that she does not drink alcohol and does not use drugs.   Family History:  The patient's family history includes Heart Problems in her maternal grandmother; Hypertension in her mother.    ROS:  Please  see the history of present illness.   Otherwise, review of systems are positive for none.   All other systems are reviewed and negative.    PHYSICAL EXAM: VS:  BP 135/81 (BP Location: Right Wrist, Patient Position: Sitting, Cuff Size: Normal)   Pulse 73   Ht 5\' 2"  (1.575 m)   Wt (!) 314 lb 2 oz (142.5 kg)   SpO2 98%   BMI 57.45 kg/m  , BMI Body mass index is 57.45 kg/m. GEN: Well nourished, well developed, in no acute distress  HEENT: normal  Neck: no JVD, carotid bruits, or masses Cardiac: RRR; no murmurs, rubs, or gallops,no edema  Respiratory:  clear to auscultation bilaterally, normal work of breathing GI: soft, nontender,  nondistended, + BS MS: no deformity or atrophy  Skin: warm and dry, no rash Neuro:  Strength and sensation are intact Psych: euthymic mood, full affect   EKG:  EKG is not ordered today. Reviewed recent EKG which showed sinus rhythm with no significant ST or T wave changes.   Recent Labs: 03/08/2022: ALT 20; BUN 20; Creatinine, Ser 0.92; Hemoglobin 13.3; Platelets 353; Potassium 3.3; Sodium 141; TSH 2.126    Lipid Panel No results found for: "CHOL", "TRIG", "HDL", "CHOLHDL", "VLDL", "LDLCALC", "LDLDIRECT"    Wt Readings from Last 3 Encounters:  05/01/22 (!) 314 lb 2 oz (142.5 kg)  03/08/22 295 lb (133.8 kg)  11/08/20 287 lb (130.2 kg)          05/01/2022    3:00 PM  PAD Screen  Previous PAD dx? No  Previous surgical procedure? No  Pain with walking? No  Feet/toe relief with dangling? No  Painful, non-healing ulcers? No  Extremities discolored? No      ASSESSMENT AND PLAN:  1.  Intermittent bradycardia: Could be due to high vagal tone.  No syncope but she does complain of intermittent episodes of dizziness and extreme fatigue.  I requested 2 weeks ZIO monitor.    2.  Shortness of breath: Suspect physical deconditioning due to gradual weight gain throughout the years.  However, we have to exclude a cardiac etiology.  I requested an echocardiogram.  3.  Morbid obesity: I discussed with her the importance of healthy lifestyle and attempted weight loss. Stop bang score is 3, sleep study evaluation should be considered at some point.    Disposition:   FU as needed if cardiac testing is abnormal.  Signed,  07/02/2022, MD  05/01/2022 3:23 PM    [facility]

## 2022-05-01 NOTE — Patient Instructions (Signed)
Medication Instructions:  Your physician recommends that you continue on your current medications as directed. Please refer to the Current Medication list given to you today.  *If you need a refill on your cardiac medications before your next appointment, please call your pharmacy*   Lab Work: None ordered If you have labs (blood work) drawn today and your tests are completely normal, you will receive your results only by: MyChart Message (if you have MyChart) OR A paper copy in the mail If you have any lab test that is abnormal or we need to change your treatment, we will call you to review the results.   Testing/Procedures: Your physician has requested that you have an echocardiogram. Echocardiography is a painless test that uses sound waves to create images of your heart. It provides your doctor with information about the size and shape of your heart and how well your heart's chambers and valves are working. This procedure takes approximately one hour. There are no restrictions for this procedure.   Your provider has ordered a heart monitor to wear for 14 days. This will be mailed to your home with instructions on placement. Once you have finished the time frame requested, you will return monitor in box provided.      Follow-Up: At [facility], you and your health needs are our priority.  As part of our continuing mission to provide you with exceptional heart care, we have created designated Provider Care Teams.  These Care Teams include your primary Cardiologist (physician) and Advanced Practice Providers (APPs -  Physician Assistants and Nurse Practitioners) who all work together to provide you with the care you need, when you need it.  We recommend signing up for the patient portal called "MyChart".  Sign up information is provided on this After Visit Summary.  MyChart is used to connect with patients for Virtual Visits (Telemedicine).  Patients are able to view lab/test results,  encounter notes, upcoming appointments, etc.  Non-urgent messages can be sent to your provider as well.   To learn more about what you can do with MyChart, go to [email or web address].    Your next appointment:   As needed pending results  The format for your next appointment:   In Person  Provider:   You may see Lorine Bears, MD or one of the following Advanced Practice Providers on your designated Care Team:   Nicolasa Ducking, NP Eula Listen, PA-C Cadence Fransico Michael, [place]   Other Instructions N/A  Important Information About Sugar

## 2022-05-03 DIAGNOSIS — R42 Dizziness and giddiness: Secondary | ICD-10-CM | POA: Diagnosis not present

## 2022-05-03 DIAGNOSIS — R001 Bradycardia, unspecified: Secondary | ICD-10-CM

## 2022-05-31 ENCOUNTER — Other Ambulatory Visit: Payer: BC Managed Care – PPO

## 2022-05-31 ENCOUNTER — Telehealth: Payer: Self-pay | Admitting: *Deleted

## 2022-05-31 NOTE — Telephone Encounter (Signed)
Pt returning a call.

## 2022-05-31 NOTE — Telephone Encounter (Signed)
Called and spoke with patient. Results reviewed with patient, pt verbalized understanding,  questions (if any) answered.   ?

## 2022-05-31 NOTE — Telephone Encounter (Signed)
-----   Message from [place] Ouch, MD sent at 05/30/2022  5:35 PM EDT ----- Inform patient that outpatient monitor was fine.  She has occasional bradycardia at rest but her heart rate seems to increase appropriately with physical activities.  I do not see evidence of significant arrhythmia and no indication for a pacemaker.

## 2022-05-31 NOTE — Telephone Encounter (Signed)
Attempted to call pt. No answer. Lmtcb.  

## 2022-06-06 ENCOUNTER — Ambulatory Visit (INDEPENDENT_AMBULATORY_CARE_PROVIDER_SITE_OTHER): Payer: BC Managed Care – PPO

## 2022-06-06 DIAGNOSIS — R0602 Shortness of breath: Secondary | ICD-10-CM | POA: Diagnosis not present

## 2022-06-06 LAB — ECHOCARDIOGRAM COMPLETE
AR max vel: 2.82 cm2
AV Area VTI: 2.96 cm2
AV Area mean vel: 2.72 cm2
AV Mean grad: 3 mmHg
AV Peak grad: 5.6 mmHg
Ao pk vel: 1.18 m/s
Area-P 1/2: 3.68 cm2
Calc EF: 57.8 %
S' Lateral: 3.3 cm
Single Plane A2C EF: 57.2 %
Single Plane A4C EF: 57.5 %

## 2022-09-30 IMAGING — US US MFM FETAL BPP W/O NON-STRESS
1 series · 15 of 20 positions shown · non-contrast
Comparison: none

[Series 1: us mfm fetal bpp w/o non-stress · 20 acquisitions, 15 frames shown]
[im 1/20]
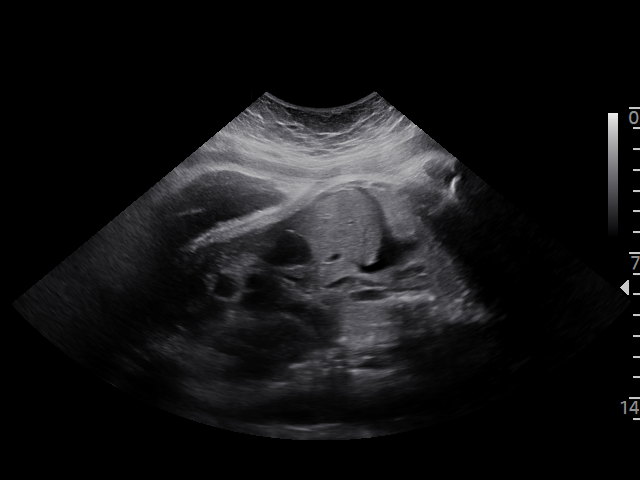
[im 3/20]
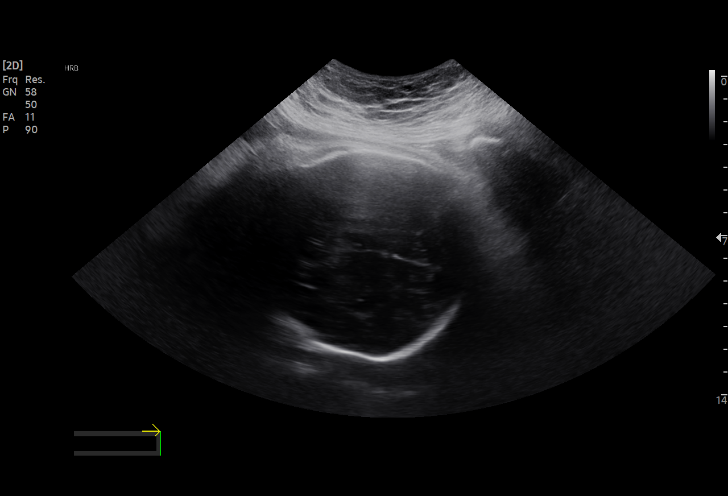
[im 4/20]
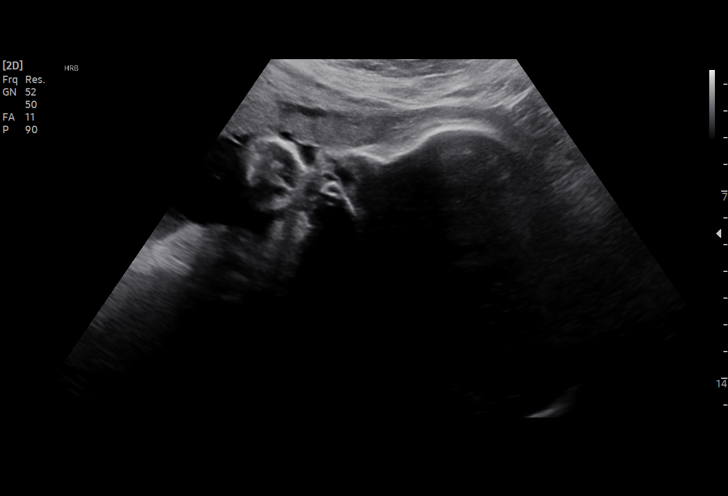
[im 5/20]
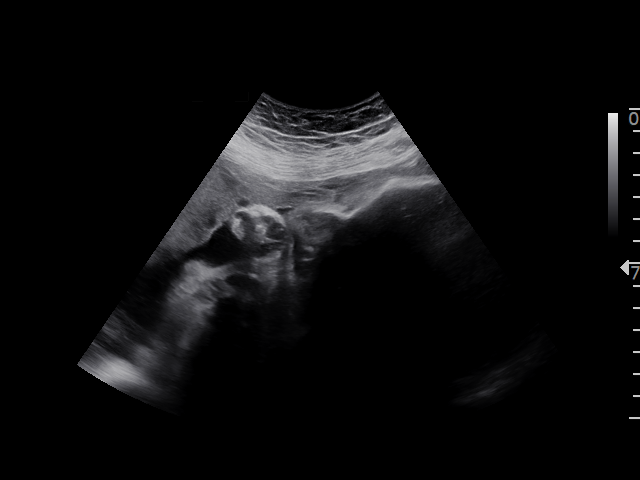
[im 7/20]
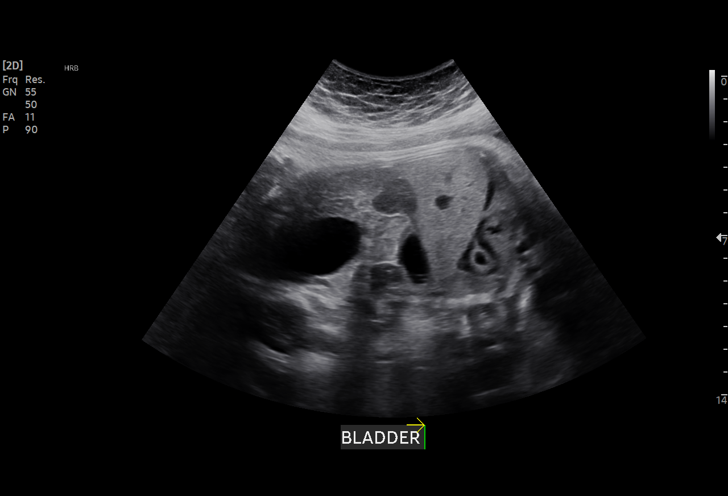
[im 8/20]
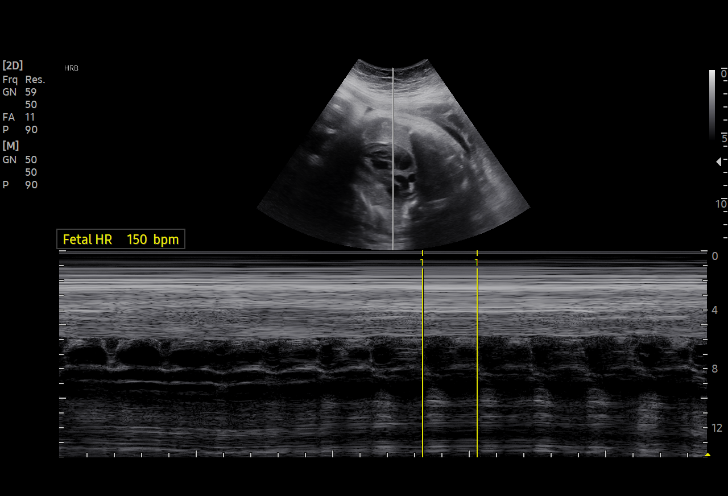
[im 9/20]
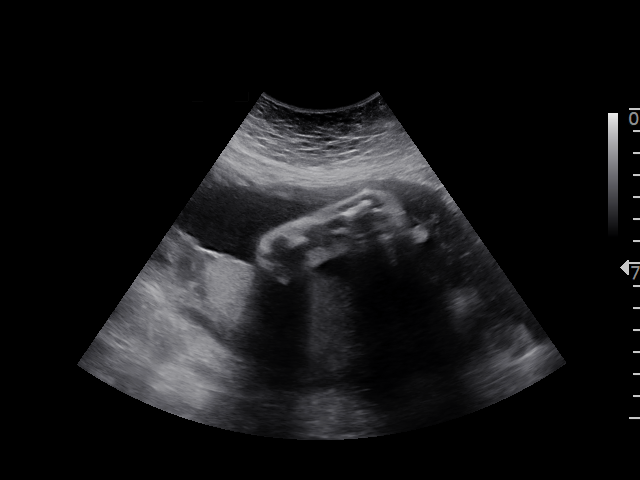
[im 11/20]
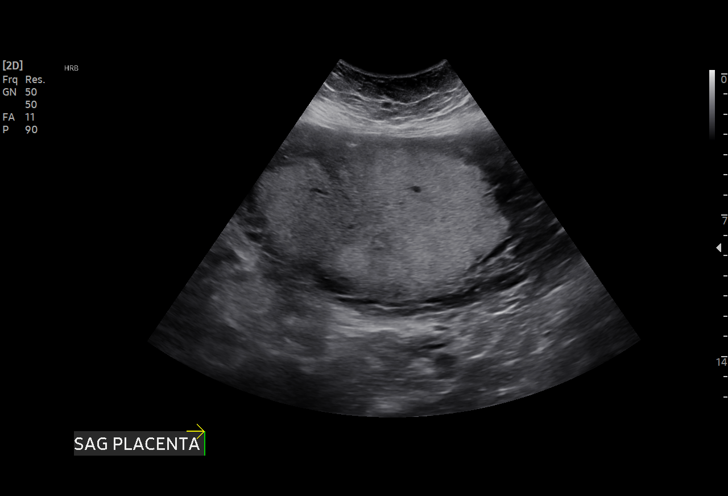
[im 12/20]
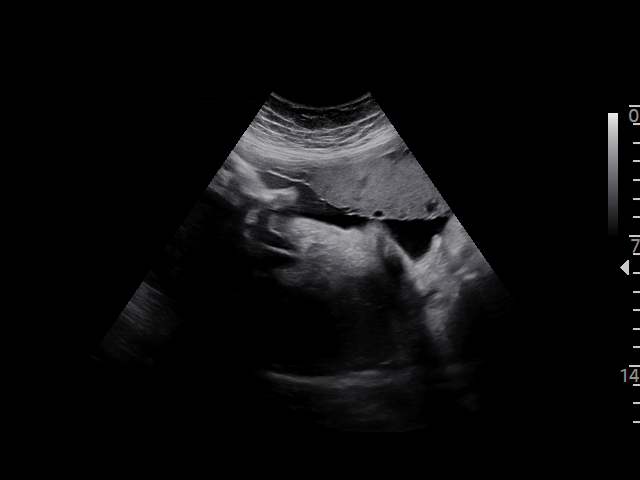
[im 13/20]
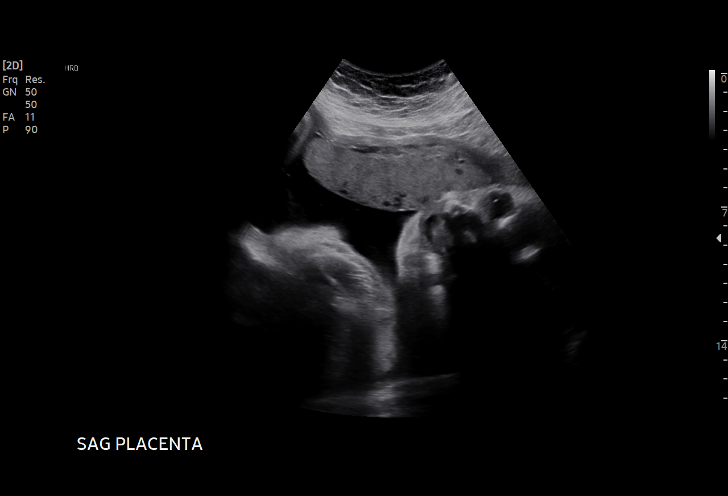
[im 15/20]
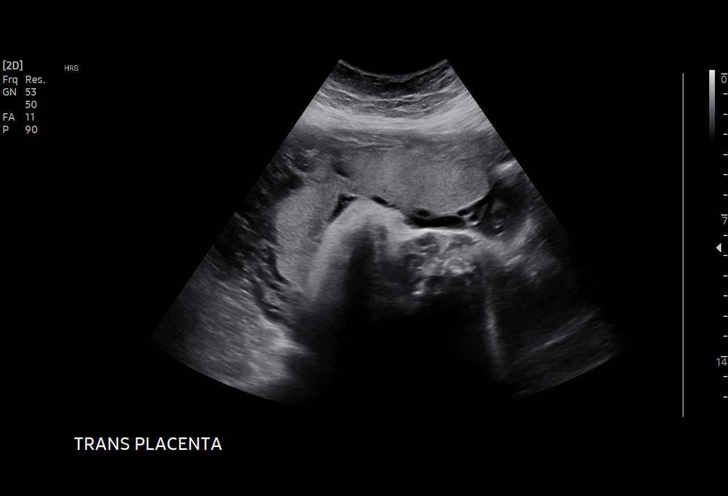
[im 16/20]
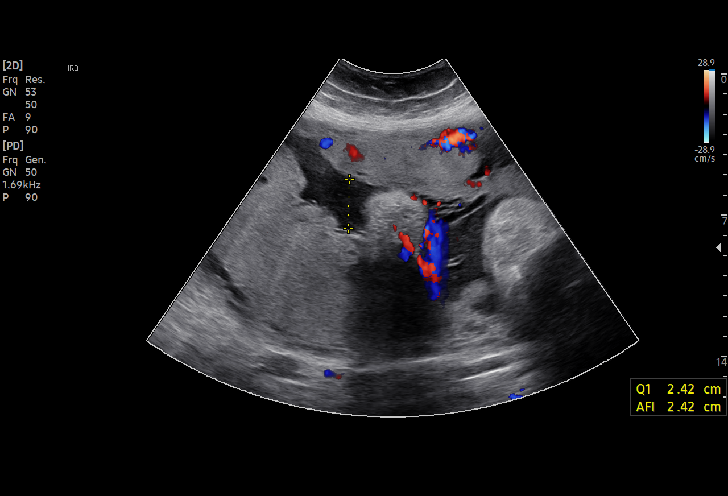
[im 17/20]
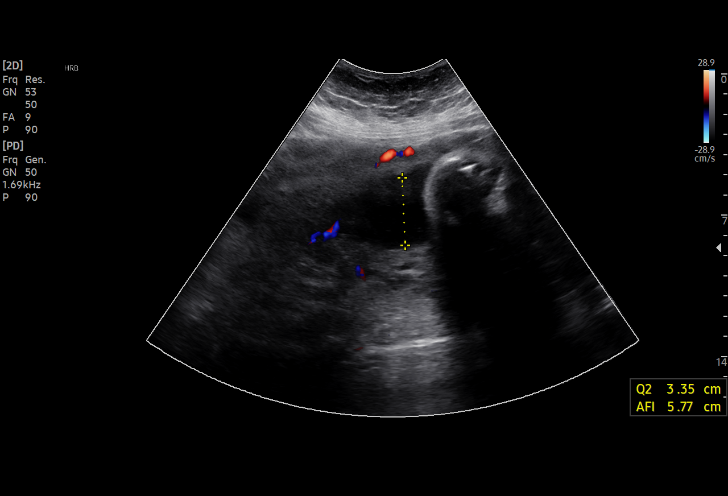
[im 19/20]
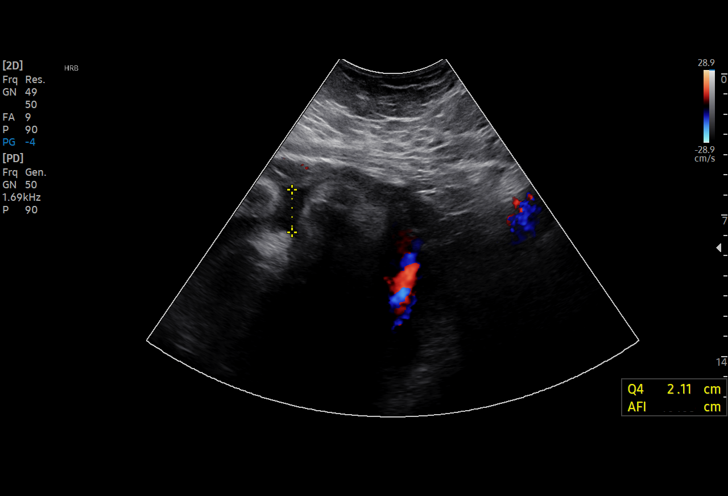
[im 20/20]
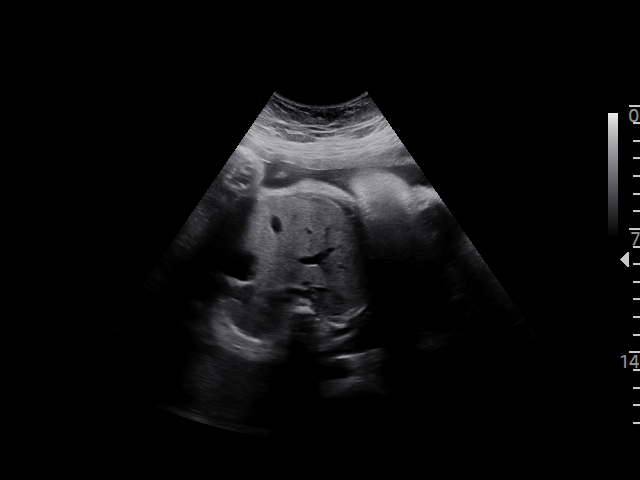

[15 of 20 positions shown; findings below may reference images not displayed]

Indications

 Obesity complicating pregnancy, third
 trimester (Pre-G BMI 53)
 Previous cesarean delivery, antepartum (x4)
 Poor obstetric history: Previous
 preeclampsia / eclampsia/gestational HTN
 Family history of congenital anomaly (Borge
 Bifida - sister)
 Encounter for other antenatal screening
 follow-up
 Asthma                                         GJJ.2J [id number]
 Medical complication of pregnancy (Cervical
 Dysplasia)
 38 weeks gestation of pregnancy
 Rh negative state in antepartum
Vital Signs

                                                Height:        5'2"
Fetal Evaluation

 Num Of Fetuses:         1
 Fetal Heart Rate(bpm):  150
 Cardiac Activity:       Observed
 Presentation:           Cephalic
 Placenta:               Right lateral
 P. Cord Insertion:      Previously Visualized
 Amniotic Fluid
 AFI FV:      Within normal limits

 AFI Sum(cm)     %Tile       Largest Pocket(cm)
 10.05           27

 RUQ(cm)       RLQ(cm)       LUQ(cm)        LLQ(cm)

Biophysical Evaluation

 Amniotic F.V:   Within normal limits       F. Tone:        Observed
 F. Movement:    Observed                   Score:          [DATE]
 F. Breathing:   Observed
OB History

 Gravidity:    5         Term:   4        Prem:   0        SAB:   0
 TOP:          0       Ectopic:  0        Living: 4
Gestational Age

 LMP:           38w 4d        Date:  11/29/19                 EDD:   09/04/20
 Best:          38w 4d     Det. By:  LMP  (11/29/19)          EDD:   09/04/20
Anatomy

 Stomach:               Appears normal, left   Bladder:                Appears normal
                        sided
Cervix Uterus Adnexa

 Cervix
 Not visualized (advanced GA >29wks)
Comments

 This patient was seen for a biophysical profile due to
 maternal obesity.  She denies any problems since her last
 exam.
 A biophysical profile performed today was [DATE].
 There was normal amniotic fluid noted on today's ultrasound
 exam.
 She is already scheduled for a cesarean delivery in 4 days.

## 2022-11-06 DIAGNOSIS — Z6841 Body Mass Index (BMI) 40.0 and over, adult: Secondary | ICD-10-CM | POA: Diagnosis not present

## 2022-11-06 DIAGNOSIS — M5441 Lumbago with sciatica, right side: Secondary | ICD-10-CM | POA: Diagnosis not present

## 2022-12-14 DIAGNOSIS — M545 Low back pain, unspecified: Secondary | ICD-10-CM | POA: Diagnosis not present

## 2023-01-24 DIAGNOSIS — F4322 Adjustment disorder with anxiety: Secondary | ICD-10-CM | POA: Diagnosis not present

## 2023-01-24 DIAGNOSIS — M79605 Pain in left leg: Secondary | ICD-10-CM | POA: Diagnosis not present

## 2023-01-24 DIAGNOSIS — Z6841 Body Mass Index (BMI) 40.0 and over, adult: Secondary | ICD-10-CM | POA: Diagnosis not present

## 2023-01-24 DIAGNOSIS — M7989 Other specified soft tissue disorders: Secondary | ICD-10-CM | POA: Diagnosis not present

## 2023-01-24 DIAGNOSIS — Z79899 Other long term (current) drug therapy: Secondary | ICD-10-CM | POA: Diagnosis not present

## 2023-02-26 ENCOUNTER — Other Ambulatory Visit: Payer: Self-pay | Admitting: *Deleted

## 2023-02-26 DIAGNOSIS — M7989 Other specified soft tissue disorders: Secondary | ICD-10-CM

## 2023-03-01 DIAGNOSIS — K529 Noninfective gastroenteritis and colitis, unspecified: Secondary | ICD-10-CM | POA: Diagnosis not present

## 2023-03-01 DIAGNOSIS — Z6841 Body Mass Index (BMI) 40.0 and over, adult: Secondary | ICD-10-CM | POA: Diagnosis not present

## 2023-03-01 DIAGNOSIS — F4322 Adjustment disorder with anxiety: Secondary | ICD-10-CM | POA: Diagnosis not present

## 2023-03-05 ENCOUNTER — Ambulatory Visit (HOSPITAL_COMMUNITY)
Admission: RE | Admit: 2023-03-05 | Discharge: 2023-03-05 | Disposition: A | Discharge status: Home / Self Care | Payer: BC Managed Care – PPO | Source: Ambulatory Visit | Attending: Vascular Surgery | Admitting: Vascular Surgery

## 2023-03-05 DIAGNOSIS — M7989 Other specified soft tissue disorders: Secondary | ICD-10-CM | POA: Diagnosis not present

## 2023-03-05 NOTE — Progress Notes (Unsigned)
[facility]           OF [place]  History and Physical   Mary Campos is a 37 y.o. female who presents with BLE swelling and spider veins.   She states that she has swelling in both legs.  She has itchiness in both legs around the spider veins.  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  She has never had any hx of DVT.  She has worn compression in the past but really does not like them.  She has family hx with her mother and grandmother. She does not have skin color changes in her legs. She does swim quite a bit, which gives her relief in her legs.  She states she has a slipped disc and has pain in the right leg from this.   She grew up in [place] and graduated for [employer].    The pt is not on a statin for cholesterol management.  The pt is not on a daily aspirin.   Other AC:  none The pt is not on medication for hypertension.   The pt is not on medication for diabetes.   Tobacco hx:  never  Pt does not have family hx of AAA.  Past Medical History:  Diagnosis Date   Asthma    albuteral inhaler PRN   Depression    hx mild ppd   History of pancreatitis    Vaginal Pap smear, abnormal     Past Surgical History:  Procedure Laterality Date   CESAREAN SECTION     X 2   CESAREAN SECTION N/A 04/11/2018   Procedure: REPEAT CESAREAN SECTION;  Surgeon: K[place] Bing, MD;  Location: [facility]IRTHING SU[facility];  Service: Obstetrics;  Laterality: N/A;   CESAREAN SECTION WITH BILATERAL TUBAL LIGATION Bilateral 07/03/2016   Procedure: CESAREAN SECTION/ Female 8lbs. 3oz;  Surgeon: Nadara Mustard, MD;  Location: [facility] ORS;  Service: Obstetrics;  Laterality: Bilateral;   CESAREAN SECTION WITH BILATERAL TUBAL LIGATION N/A 08/29/2020   Procedure: CESAREAN SECTION WITH BILATERAL TUBAL LIGATION;  Surgeon: Smolan Bing, MD;  Location: [facility] OR[facility];  Service: Obstetrics;  Laterality: N/A;   CHOLECYSTECTOMY     COLPOSCOPY W/ BIOPSY / CURETTAGE   11/12/2019       TONSILLECTOMY      Social History   Socioeconomic History   Marital status: Married    Spouse name: Not on file   Number of children: Not on file   Years of education: Not on file   Highest education level: Not on file  Occupational History   Not on file  Tobacco Use   Smoking status: Never   Smokeless tobacco: Never  Vaping Use   Vaping Use: Never used  Substance and Sexual Activity   Alcohol use: No   Drug use: No   Sexual activity: Yes    Birth control/protection: None  Other Topics Concern   Not on file  Social History Narrative   Not on file   Social Determinants of Health   Financial Resource Strain: Not on file  Food Insecurity: Not on file  Transportation Needs: Not on file  Physical Activity: Not on file  Stress: Not on file  Social Connections: Not on file  Intimate Partner Violence: Not on file    Family History  Problem Relation Age of Onset   Hypertension Mother    Heart Problems Maternal Grandmother  Current Outpatient Medications  Medication Sig Dispense Refill   albuterol (VENTOLIN HFA) 108 (90 Base) MCG/ACT inhaler Inhale 2 puffs into the lungs every 4 (four) hours as needed for wheezing or shortness of breath. 18 g 2   citalopram (CELEXA) 10 MG tablet Take 10 mg by mouth daily.     ibuprofen (ADVIL) 800 MG tablet Take 1 tablet (800 mg total) by mouth every 8 (eight) hours. 30 tablet 0   loratadine (CLARITIN) 10 MG tablet Take 10 mg by mouth at bedtime as needed (allergies (alternates between allegra, singulair & claritin)).      montelukast (SINGULAIR) 10 MG tablet Take 1 tablet (10 mg total) by mouth at bedtime as needed. 30 tablet 2   No current facility-administered medications for this visit.    Allergies  Allergen Reactions   Azithromycin Shortness Of Breath    Stomach spasms    Eletriptan Hydrobromide Shortness Of Breath    Stomach spasms   Pseudoephedrine Palpitations    Loses focus    Tape Rash    Surgical  tape    REVIEW OF SYSTEMS:   [X]  denotes positive finding, [ ]  denotes negative finding Cardiac  Comments:  Chest pain or chest pressure:    Shortness of breath upon exertion:    Short of breath when lying flat:    Irregular heart rhythm:        Vascular    Pain in calf, thigh, or hip brought on by ambulation:    Pain in feet at night that wakes you up from your sleep:     Campos clot in your veins:    Leg swelling:  x       Pulmonary    Oxygen at home:    Productive cough:     Wheezing:         Neurologic    Sudden weakness in arms or legs:     Sudden numbness in arms or legs:     Sudden onset of difficulty speaking or slurred speech:    Temporary loss of vision in one eye:     Problems with dizziness:         Gastrointestinal    Campos in stool:     Vomited Campos:         Genitourinary    Burning when urinating:     Campos in urine:        Psychiatric    Major depression:         Hematologic    Bleeding problems:    Problems with Campos clotting too easily:        Skin    Rashes or ulcers:        Constitutional    Fever or chills:      PHYSICAL EXAMINATION:  Today's Vitals   03/07/23 1034 03/07/23 1038  BP: (!) 144/84   Pulse: 87   Resp: 18   Temp: 97.6 F (36.4 C)   TempSrc: Temporal   SpO2: 96%   Weight: 299 lb 11.2 oz (135.9 kg)   Height: 5\' 2"  (1.575 m)   PainSc: 4  4   PainLoc: Leg    Body mass index is 54.82 kg/m.   General:  WDWN in NAD; vital signs documented above Gait: Not observed HENT: WNL, normocephalic Pulmonary: normal non-labored breathing without wheezing Cardiac: regular HR; without carotid bruits Abdomen: soft, obese, NT, aortic pulse is not palpable Skin: without rashes Vascular Exam/Pulses:  Right Left  Radial 2+ (normal)  2+ (normal)  DP 2+ (normal) 2+ (normal)   Extremities: mild BLE swelling with scattered spider veins on both legs.   Neurologic: A&O X 3;  moving all extremities equally Psychiatric:  The pt has  Normal affect.   Non-Invasive Vascular Imaging:   Venous duplex on 03/05/2023: +--------------+---------+------+-----------+------------+--------+  LEFT         Reflux NoRefluxReflux TimeDiameter cmsComments                          Yes                                   +--------------+---------+------+-----------+------------+--------+  CFV                    yes   >1 second                       +--------------+---------+------+-----------+------------+--------+  FV prox       no                                              +--------------+---------+------+-----------+------------+--------+  FV mid        no                                              +--------------+---------+------+-----------+------------+--------+  FV dist       no                                              +--------------+---------+------+-----------+------------+--------+  Popliteal    no                                              +--------------+---------+------+-----------+------------+--------+  GSV at SFJ              yes    >500 ms     0.978              +--------------+---------+------+-----------+------------+--------+  GSV prox thighno                           0.458              +--------------+---------+------+-----------+------------+--------+  GSV mid thigh no                           0.527              +--------------+---------+------+-----------+------------+--------+  GSV dist thighno                           0.617              +--------------+---------+------+-----------+------------+--------+  GSV at knee   no  0.581              +--------------+---------+------+-----------+------------+--------+  GSV prox calf no                           0.379              +--------------+---------+------+-----------+------------+--------+  SSV Pop Fossa no                            0.403              +--------------+---------+------+-----------+------------+--------+  SSV prox calf no                           0.356              +--------------+---------+------+-----------+------------+--------+  SSV mid calf  no                           0.311              +--------------+---------+------+-----------+------------+--------+   Summary:  Left:  - No evidence of deep vein thrombosis seen in the left lower extremity,  from the common femoral through the popliteal veins.  - No evidence of superficial venous thrombosis in the left lower extremity.  - Deep vein reflux in the CFV.  - Superficial vein reflux in the SFJ.     Mary Campos is a 37 y.o. female who presents with: BLE swelling and itchiness    -pt has easily palpable DP pedal pulses bilaterally -pt does not have evidence of DVT.  Pt does have venous reflux in the CFV and GSV at the SFJ.  She does not have any other reflux and is not a candidate for laser ablation.   -discussed with pt about wearing knee high 15-20 mmHg compression stockings and pt was measured for these today.   She will go to [facility] in [place] as this is near her parents house.  -discussed the importance of leg elevation and how to elevate properly - pt is advised to elevate their legs and a diagram is given to them to demonstrate for pt to lay flat on their back with knees elevated and slightly bent with their feet higher than their knees, which puts their feet higher than their heart for 15 minutes per day.  If pt cannot lay flat, advised to lay as flat as possible.  -pt is advised to continue as much walking as possible and avoid sitting or standing for long periods of time.  -discussed importance of weight loss and exercise and that water aerobics would also be beneficial.  She does quite a bit of swimming and states that it helps her legs.  Encouraged her to continue this.  -handout with recommendations  given -she is not interested in sclerotherapy. -discussed how to hold pressure should any of her spider veins ever bleed.  -pt will f/u as needed.    Doreatha Massed, [facility] [phone]  Clinic MD:  Edilia Bo

## 2023-03-07 ENCOUNTER — Ambulatory Visit: Payer: BC Managed Care – PPO | Admitting: Physician Assistant

## 2023-03-07 ENCOUNTER — Encounter: Payer: Self-pay | Admitting: Physician Assistant

## 2023-03-07 VITALS — BP 144/84 | HR 87 | Temp 97.6°F | Resp 18 | Ht 62.0 in | Wt 299.7 lb

## 2023-03-07 DIAGNOSIS — M7989 Other specified soft tissue disorders: Secondary | ICD-10-CM

## 2023-04-09 DIAGNOSIS — Z1339 Encounter for screening examination for other mental health and behavioral disorders: Secondary | ICD-10-CM | POA: Diagnosis not present

## 2023-04-09 DIAGNOSIS — M545 Low back pain, unspecified: Secondary | ICD-10-CM | POA: Diagnosis not present

## 2023-04-09 DIAGNOSIS — Z01419 Encounter for gynecological examination (general) (routine) without abnormal findings: Secondary | ICD-10-CM | POA: Diagnosis not present

## 2023-04-09 DIAGNOSIS — Z1331 Encounter for screening for depression: Secondary | ICD-10-CM | POA: Diagnosis not present

## 2023-04-10 DIAGNOSIS — Z01419 Encounter for gynecological examination (general) (routine) without abnormal findings: Secondary | ICD-10-CM | POA: Diagnosis not present

## 2023-04-19 DIAGNOSIS — M5416 Radiculopathy, lumbar region: Secondary | ICD-10-CM | POA: Diagnosis not present

## 2023-04-29 ENCOUNTER — Ambulatory Visit (INDEPENDENT_AMBULATORY_CARE_PROVIDER_SITE_OTHER): Payer: BC Managed Care – PPO

## 2023-04-29 ENCOUNTER — Ambulatory Visit
Admission: RE | Admit: 2023-04-29 | Discharge: 2023-04-29 | Disposition: A | Discharge status: Home / Self Care | Payer: BC Managed Care – PPO | Source: Ambulatory Visit | Attending: Emergency Medicine | Admitting: Emergency Medicine

## 2023-04-29 VITALS — BP 108/78 | HR 91 | Temp 97.7°F | Resp 18

## 2023-04-29 DIAGNOSIS — M25531 Pain in right wrist: Secondary | ICD-10-CM

## 2023-04-29 DIAGNOSIS — M25539 Pain in unspecified wrist: Secondary | ICD-10-CM | POA: Insufficient documentation

## 2023-04-29 DIAGNOSIS — S60211A Contusion of right wrist, initial encounter: Secondary | ICD-10-CM | POA: Diagnosis not present

## 2023-04-29 NOTE — ED Triage Notes (Signed)
Patient to Urgent Care with complaints of right sided wrist pain following an injury yesterday. Reports she was putting tupper-ware away in a high cabinet and it fell and hit her wrist.  Bruising present to wrist pain radiates into forearm. Icing wrist. Using a compression bandage.

## 2023-04-29 NOTE — ED Provider Notes (Signed)
Mary Campos    CSN: [id number] Arrival date & time: 04/29/23  1444      History   Chief Complaint Chief Complaint  Patient presents with   Wrist Pain    Hurt my wrist yesterday and it's still hurting today. - Entered by patient    HPI Mary Campos is a 37 y.o. female.  Patient presents with pain, swelling, bruising of her right wrist after she had a container fall on it accidentally yesterday.  The pain radiates to her forearm.  No numbness, weakness, open wounds, or other symptoms.  Treating with Ace wrap and ice packs.  The history is provided by the patient and medical records.    Past Medical History:  Diagnosis Date   Asthma    albuteral inhaler PRN   Depression    hx mild ppd   History of pancreatitis    Vaginal Pap smear, abnormal     Patient Active Problem List   Diagnosis Date Noted   Wrist pain 04/29/2023   Postpartum depression 09/29/2020   History of bilateral tubal ligation 08/29/2020   Status post cesarean section 08/29/2020   Previous cesarean section 04/20/2020   Family history of spina bifida 02/27/2020   Supervision of high risk pregnancy, antepartum 02/24/2020   Cervical dysplasia 11/16/2019   LGSIL on Pap smear of cervix with negative HPV on 10/26/2019 10/26/2019   History of postpartum depression 04/11/2018   BMI 50.0-59.9, adult (HCC) 02/21/2018   Maternal obesity, antepartum 08/29/2017   History of gestational hypertension 08/26/2017   Cesarean delivery delivered 07/04/2016   Anxiety state 08/11/2010   Asthma 08/11/2010    Past Surgical History:  Procedure Laterality Date   CESAREAN SECTION     X 2   CESAREAN SECTION N/A 04/11/2018   Procedure: REPEAT CESAREAN SECTION;  Surgeon: [place] Bing, MD;  Location: [facility] BIRTHING SUITES;  Service: Obstetrics;  Laterality: N/A;   CESAREAN SECTION WITH BILATERAL TUBAL LIGATION Bilateral 07/03/2016   Procedure: CESAREAN SECTION/ Female 8lbs. 3oz;  Surgeon: Nadara Mustard, MD;   Location: [facility] ORS;  Service: Obstetrics;  Laterality: Bilateral;   CESAREAN SECTION WITH BILATERAL TUBAL LIGATION N/A 08/29/2020   Procedure: CESAREAN SECTION WITH BILATERAL TUBAL LIGATION;  Surgeon: [place] Bing, MD;  Location: [facility] LD ORS;  Service: Obstetrics;  Laterality: N/A;   CHOLECYSTECTOMY     COLPOSCOPY W/ BIOPSY / CURETTAGE  11/12/2019       TONSILLECTOMY      OB History     Gravida  5   Para  5   Term  5   Preterm  0   AB  0   Living  5      SAB  0   IAB  0   Ectopic  0   Multiple  0   Live Births  5            Home Medications    Prior to Admission medications   Medication Sig Start Date End Date Taking? Authorizing Provider  albuterol (VENTOLIN HFA) 108 (90 Base) MCG/ACT inhaler Inhale 2 puffs into the lungs every 4 (four) hours as needed for wheezing or shortness of breath. 04/20/20   Federico Flake, MD  citalopram (CELEXA) 10 MG tablet Take 10 mg by mouth daily.    [provider]  ibuprofen (ADVIL) 800 MG tablet Take 1 tablet (800 mg total) by mouth every 8 (eight) hours. 08/31/20   Venora Maples, MD  loratadine (CLARITIN) 10 MG  tablet Take 10 mg by mouth at bedtime as needed (allergies (alternates between allegra, singulair & claritin)).     [provider]  montelukast (SINGULAIR) 10 MG tablet Take 1 tablet (10 mg total) by mouth at bedtime as needed. 09/29/20   Cable Bing, MD    Family History Family History  Problem Relation Age of Onset   Hypertension Mother    Heart Problems Maternal Grandmother     Social History Social History   Tobacco Use   Smoking status: Never   Smokeless tobacco: Never  Vaping Use   Vaping Use: Never used  Substance Use Topics   Alcohol use: No   Drug use: No     Allergies   Azithromycin, Eletriptan hydrobromide, Pseudoephedrine, and Tape   Review of Systems Review of Systems  Constitutional:  Negative for chills and fever.  Musculoskeletal:  Positive for  arthralgias and joint swelling.  Skin:  Positive for color change. Negative for wound.  Neurological:  Negative for weakness and numbness.  All other systems reviewed and are negative.    Physical Exam Triage Vital Signs ED Triage Vitals  Enc Vitals Group     BP      Pulse      Resp      Temp      Temp src      SpO2      Weight      Height      Head Circumference      Peak Flow      Pain Score      Pain Loc      Pain Edu?      Excl. in GC?    No data found.  Updated Vital Signs BP 108/78   Pulse 91   Temp 97.7 F (36.5 C)   Resp 18   LMP 04/23/2023   SpO2 98%   Visual Acuity Right Eye Distance:   Left Eye Distance:   Bilateral Distance:    Right Eye Near:   Left Eye Near:    Bilateral Near:     Physical Exam Vitals and nursing note reviewed.  Constitutional:      General: She is not in acute distress.    Appearance: She is well-developed.  HENT:     Mouth/Throat:     Mouth: Mucous membranes are moist.  Cardiovascular:     Rate and Rhythm: Normal rate and regular rhythm.     Heart sounds: Normal heart sounds.  Pulmonary:     Effort: Pulmonary effort is normal. No respiratory distress.     Breath sounds: Normal breath sounds.  Musculoskeletal:        General: Swelling and tenderness present. No deformity. Normal range of motion.       Arms:     Cervical back: Neck supple.  Skin:    General: Skin is warm and dry.     Capillary Refill: Capillary refill takes less than 2 seconds.     Findings: Bruising present. No erythema or lesion.  Neurological:     General: No focal deficit present.     Mental Status: She is alert and oriented to person, place, and time.     Sensory: No sensory deficit.     Motor: No weakness.  Psychiatric:        Mood and Affect: Mood normal.        Behavior: Behavior normal.      UC Treatments / Results  Labs (all labs ordered are  listed, but only abnormal results are displayed) Labs Reviewed - No data to  display  EKG   Radiology DG Wrist Complete Right  Result Date: 04/29/2023 CLINICAL DATA:  Right wrist pain after injury yesterday. EXAM: RIGHT WRIST - COMPLETE 3+ VIEW COMPARISON:  None Available. FINDINGS: There is no evidence of fracture or dislocation. There is no evidence of arthropathy or other focal bone abnormality. Soft tissues are unremarkable. IMPRESSION: Negative. Electronically Signed   By: Lupita Raider M.D.   On: 04/29/2023 15:18    Procedures Procedures (including critical care time)  Medications Ordered in UC Medications - No data to display  Initial Impression / Assessment and Plan / UC Course  I have reviewed the triage vital signs and the nursing notes.  Pertinent labs & imaging results that were available during my care of the patient were reviewed by me and considered in my medical decision making (see chart for details).    Right wrist pain and contusion.  Treating with wrist splint, rest, elevation, ice packs, ibuprofen or Tylenol.  Education provided on wrist pain.  Instructed patient to follow-up with orthopedics if her symptoms are not improving.  Contact information for on-call Ortho provided.  Patient agrees to plan of care.   Final Clinical Impressions(s) / UC Diagnoses   Final diagnoses:  Right wrist pain  Contusion of right wrist, initial encounter     Discharge Instructions      Take ibuprofen as directed.  Rest and elevate your wrist.  Apply ice packs 2-3 times a day for up to 20 minutes each.  Wear the wrist brace as needed for comfort.    Follow up with an orthopedist if your symptoms are not improving.        ED Prescriptions   None    PDMP not reviewed this encounter.   Mickie Bail, NP 04/29/23 [phone]

## 2023-04-29 NOTE — Discharge Instructions (Addendum)
Take ibuprofen as directed.  Rest and elevate your wrist.  Apply ice packs 2-3 times a day for up to 20 minutes each.  Wear the wrist brace as needed for comfort.    Follow up with an orthopedist if your symptoms are not improving.

## 2023-05-06 DIAGNOSIS — M5459 Other low back pain: Secondary | ICD-10-CM | POA: Diagnosis not present

## 2023-05-06 DIAGNOSIS — M5416 Radiculopathy, lumbar region: Secondary | ICD-10-CM | POA: Diagnosis not present

## 2023-05-23 DIAGNOSIS — M5459 Other low back pain: Secondary | ICD-10-CM | POA: Diagnosis not present

## 2023-05-23 DIAGNOSIS — M5416 Radiculopathy, lumbar region: Secondary | ICD-10-CM | POA: Diagnosis not present

## 2023-06-06 DIAGNOSIS — M5416 Radiculopathy, lumbar region: Secondary | ICD-10-CM | POA: Diagnosis not present

## 2023-06-06 DIAGNOSIS — M5459 Other low back pain: Secondary | ICD-10-CM | POA: Diagnosis not present

## 2023-06-13 DIAGNOSIS — M5416 Radiculopathy, lumbar region: Secondary | ICD-10-CM | POA: Diagnosis not present

## 2023-06-13 DIAGNOSIS — M5459 Other low back pain: Secondary | ICD-10-CM | POA: Diagnosis not present

## 2023-06-20 DIAGNOSIS — M5416 Radiculopathy, lumbar region: Secondary | ICD-10-CM | POA: Diagnosis not present

## 2023-06-20 DIAGNOSIS — M5459 Other low back pain: Secondary | ICD-10-CM | POA: Diagnosis not present

## 2023-06-27 DIAGNOSIS — M5416 Radiculopathy, lumbar region: Secondary | ICD-10-CM | POA: Diagnosis not present

## 2023-06-27 DIAGNOSIS — M5459 Other low back pain: Secondary | ICD-10-CM | POA: Diagnosis not present

## 2023-06-28 DIAGNOSIS — M5416 Radiculopathy, lumbar region: Secondary | ICD-10-CM | POA: Diagnosis not present

## 2023-07-25 DIAGNOSIS — M5416 Radiculopathy, lumbar region: Secondary | ICD-10-CM | POA: Diagnosis not present

## 2023-08-02 DIAGNOSIS — M5416 Radiculopathy, lumbar region: Secondary | ICD-10-CM | POA: Diagnosis not present

## 2023-09-04 DIAGNOSIS — M5416 Radiculopathy, lumbar region: Secondary | ICD-10-CM | POA: Diagnosis not present

## 2023-10-06 DIAGNOSIS — S93402A Sprain of unspecified ligament of left ankle, initial encounter: Secondary | ICD-10-CM | POA: Diagnosis not present

## 2023-11-26 ENCOUNTER — Ambulatory Visit: Payer: BC Managed Care – PPO | Admitting: Obstetrics and Gynecology

## 2024-07-31 ENCOUNTER — Ambulatory Visit
Admission: RE | Admit: 2024-07-31 | Discharge: 2024-07-31 | Disposition: A | Discharge status: Home / Self Care | Source: Ambulatory Visit

## 2024-07-31 VITALS — BP 125/76 | HR 69 | Temp 98.3°F | Resp 20

## 2024-07-31 DIAGNOSIS — S31109A Unspecified open wound of abdominal wall, unspecified quadrant without penetration into peritoneal cavity, initial encounter: Secondary | ICD-10-CM

## 2024-07-31 MED ORDER — DOXYCYCLINE MONOHYDRATE 25 MG/5ML PO SUSR: 100.0000 mg | Freq: Two times a day (BID) | ORAL | 0 refills | Status: DC

## 2024-07-31 MED ORDER — CEPHALEXIN 250 MG/5ML PO SUSR: 500.0000 mg | Freq: Three times a day (TID) | ORAL | 0 refills | Status: AC

## 2024-07-31 NOTE — Discharge Instructions (Signed)
 Today you are evaluated for the wound to your abdomen which does appear to be infected, most likely was caused by a small scratch or opening to the skin and progressively worsened Lavaun was present  Take doxycycline twice daily for 10 days for treatment of bacteria  If you have not seen any improvement by Monday you need to follow-up for in person reevaluation in urgent care with your primary doctor  If symptoms are improving continue antibiotic course as directed with follow-up with your primary doctor around the end of treatment  Cleanse over the area with unscented water  daily, pat and do not rub  If the area opens and begins to drain you may cover with a nonstick bandage  You may take Tylenol  as needed for pain

## 2024-07-31 NOTE — ED Provider Notes (Signed)
 Mary Campos    CSN: [id number] Arrival date & time: 07/31/24  1812      History   Chief Complaint Chief Complaint  Patient presents with   Blister    Red, warm, very sore. - Entered by patient    HPI Mary Campos is a 38 y.o. female.   Patient presents for evaluation of a wound to the left lower abdomen beginning 4 to 5 days ago.  Endorses that she reached down to scratch her lower abdomen and felt fluid they noticed yellow discoloration of her clothing.  Symptoms have progressively worsened throughout the week as site has become painful and discolored.  Has not applied Polysporin with no improvement.  Denies drainage or fever  Past Medical History:  Diagnosis Date   Asthma    albuteral inhaler PRN   Depression    hx mild ppd   History of pancreatitis    Vaginal Pap smear, abnormal     Patient Active Problem List   Diagnosis Date Noted   Wrist pain 04/29/2023   Postpartum depression 09/29/2020   History of bilateral tubal ligation 08/29/2020   Status post cesarean section 08/29/2020   Previous cesarean section 04/20/2020   Family history of spina bifida 02/27/2020   Supervision of high risk pregnancy, antepartum 02/24/2020   Cervical dysplasia 11/16/2019   LGSIL on Pap smear of cervix with negative HPV on 10/26/2019 10/26/2019   History of postpartum depression 04/11/2018   BMI 50.0-59.9, adult (HCC) 02/21/2018   Maternal obesity, antepartum 08/29/2017   History of gestational hypertension 08/26/2017   Cesarean delivery delivered 07/04/2016   Anxiety state 08/11/2010   Asthma 08/11/2010    Past Surgical History:  Procedure Laterality Date   CESAREAN SECTION     X 2   CESAREAN SECTION N/A 04/11/2018   Procedure: REPEAT CESAREAN SECTION;  Surgeon: Izell Harari, MD;  Location: [facility] BIRTHING SUIT[facility];  Service: Obstetrics;  Laterality: N/A;   CESAREAN SECTION WITH BILATERAL TUBAL LIGATION Bilateral 07/03/2016   Procedure: CESAREAN SECTION/  Female 8lbs. 3oz;  Surgeon: Lamar SHAUNNA Lesches, MD;  Location: [facility] ORS;  Service: Obstetrics;  Laterality: Bilateral;   CESAREAN SECTION WITH BILATERAL TUBAL LIGATION N/A 08/29/2020   Procedure: CESAREAN SECTION WITH BILATERAL TUBAL LIGATION;  Surgeon: Izell Harari, MD;  Location: [facility] LD ORS;  Service: Obstetrics;  Laterality: N/A;   CHOLECYSTECTOMY     COLPOSCOPY W/ BIOPSY / CURETTAGE  11/12/2019       TONSILLECTOMY      OB History     Gravida  5   Para  5   Term  5   Preterm  0   AB  0   Living  5      SAB  0   IAB  0   Ectopic  0   Multiple  0   Live Births  5            Home Medications    Prior to Admission medications   Medication Sig Start Date End Date Taking? Authorizing Provider  doxycycline (VIBRAMYCIN) 25 MG/5ML SUSR Take 20 mLs (100 mg total) by mouth 2 (two) times daily for 10 days. 07/31/24 08/10/24 Yes Ruger Saxer, Shelba SAUNDERS, NP  albuterol  (VENTOLIN  HFA) 108 (90 Base) MCG/ACT inhaler Inhale 2 puffs into the lungs every 4 (four) hours as needed for wheezing or shortness of breath. 04/20/20   Eldonna Suzen Octave, MD  citalopram (CELEXA) 10 MG tablet Take 10 mg by mouth daily.  [provider]  ibuprofen  (ADVIL ) 800 MG tablet Take 1 tablet (800 mg total) by mouth every 8 (eight) hours. 08/31/20   Lola Donnice HERO, MD  loratadine  (CLARITIN ) 10 MG tablet Take 10 mg by mouth at bedtime as needed (allergies (alternates between allegra, singulair  & claritin )).     [provider]  montelukast  (SINGULAIR ) 10 MG tablet Take 1 tablet (10 mg total) by mouth at bedtime as needed. 09/29/20   Izell Harari, MD  omeprazole (PRILOSEC) 40 MG capsule Take 40 mg by mouth daily.    [provider]    Family History Family History  Problem Relation Age of Onset   Hypertension Mother    Heart Problems Maternal Grandmother     Social History Social History   Tobacco Use   Smoking status: Never   Smokeless tobacco: Never  Vaping Use    Vaping status: Never Used  Substance Use Topics   Alcohol use: No   Drug use: No     Allergies   Azithromycin, Eletriptan hydrobromide, Pseudoephedrine, and Tape   Review of Systems Review of Systems   Physical Exam Triage Vital Signs ED Triage Vitals  Encounter Vitals Group     BP 07/31/24 1820 125/76     Girls Systolic BP Percentile --      Girls Diastolic BP Percentile --      Boys Systolic BP Percentile --      Boys Diastolic BP Percentile --      Pulse Rate 07/31/24 1820 69     Resp 07/31/24 1820 20     Temp 07/31/24 1820 98.3 F (36.8 C)     Temp Source 07/31/24 1820 Oral     SpO2 07/31/24 1820 98 %     Weight --      Height --      Head Circumference --      Peak Flow --      Pain Score 07/31/24 1824 7     Pain Loc --      Pain Education --      Exclude from Growth Chart --    No data found.  Updated Vital Signs BP 125/76 (BP Location: Right Arm)   Pulse 69   Temp 98.3 F (36.8 C) (Oral)   Resp 20   LMP 06/01/2024 Comment: irregular peroids  SpO2 98%   Breastfeeding No   Visual Acuity Right Eye Distance:   Left Eye Distance:   Bilateral Distance:    Right Eye Near:   Left Eye Near:    Bilateral Near:     Physical Exam Constitutional:      Appearance: Normal appearance.  Eyes:     Extraocular Movements: Extraocular movements intact.  Pulmonary:     Effort: Pulmonary effort is normal.  Neurological:     Mental Status: She is alert and oriented to person, place, and time.      UC Treatments / Results  Labs (all labs ordered are listed, but only abnormal results are displayed) Labs Reviewed - No data to display  EKG   Radiology No results found.  Procedures Procedures (including critical care time)  Medications Ordered in UC Medications - No data to display  Initial Impression / Assessment and Plan / UC Course  I have reviewed the triage vital signs and the nursing notes.  Pertinent labs & imaging results that were  available during my care of the patient were reviewed by me and considered in my medical decision making (see  chart for details).  Open wound of abdomen, initial encounter  Etiology unknown, most likel superficial opening to the skin as patient denies any injury or trauma, at this time appears to be infected, purulent drainage underneath the skin able to be seen, prescribed doxycycline recommended daily cleansing and covering with a nonadherent dressing if site begins to drain, while wound is close May apply topical medication for pain as well as take oral Tylenol , given strict follow-up precautions that if no improvement seen within 72 hours she is to seek in person evaluation and recommended PCP follow-up if symptoms are improving at end of antibiotic course Final Clinical Impressions(s) / UC Diagnoses   Final diagnoses:  Open wound of abdomen, initial encounter     Discharge Instructions      Today you are evaluated for the wound to your abdomen which does appear to be infected, most likely was caused by a small scratch or opening to the skin and progressively worsened Lavaun was present  Take doxycycline twice daily for 10 days for treatment of bacteria  If you have not seen any improvement by Monday you need to follow-up for in person reevaluation in urgent care with your primary doctor  If symptoms are improving continue antibiotic course as directed with follow-up with your primary doctor around the end of treatment  Cleanse over the area with unscented water  daily, pat and do not rub  If the area opens and begins to drain you may cover with a nonstick bandage  You may take Tylenol  as needed for pain   ED Prescriptions     Medication Sig Dispense Auth. Provider   doxycycline (VIBRAMYCIN) 25 MG/5ML SUSR Take 20 mLs (100 mg total) by mouth 2 (two) times daily for 10 days. 400 mL Teresa Shelba SAUNDERS, NP      PDMP not reviewed this encounter.   Teresa Shelba SAUNDERS,  NP 07/31/24 [phone]

## 2024-07-31 NOTE — ED Triage Notes (Signed)
 Patient reports painful, itchy blisters on left Lower abdominal that started Sunday. Patient reports that blister popped on Sunday. Rates pain 6/10. Patient has been using Polysporin  with mild relief.

## 2024-08-02 ENCOUNTER — Encounter: Payer: Self-pay | Admitting: Emergency Medicine

## 2024-08-02 ENCOUNTER — Ambulatory Visit
Admission: EM | Admit: 2024-08-02 | Discharge: 2024-08-02 | Disposition: A | Discharge status: Home / Self Care | Attending: Emergency Medicine | Admitting: Emergency Medicine

## 2024-08-02 DIAGNOSIS — Z5189 Encounter for other specified aftercare: Secondary | ICD-10-CM | POA: Diagnosis not present

## 2024-08-02 DIAGNOSIS — S31109A Unspecified open wound of abdominal wall, unspecified quadrant without penetration into peritoneal cavity, initial encounter: Secondary | ICD-10-CM | POA: Insufficient documentation

## 2024-08-02 NOTE — Discharge Instructions (Addendum)
 Today you are evaluated for the wound, on initial evaluation there was pus present on the skin therefore area draining is normal  As it has begun to drain a sample has been obtained to ensure that you are on proper antibiotics, pending for up to 3 days and you will be notified if medicine needs to be changed  Continue taking antibiotic as directed  Clean over the area at least once daily with unscented soap and water , pat and do not rub, may cover with a nonstick dressing such as a Band-Aid while draining  Please schedule follow-up appointment with your primary doctor for recheck of your wound towards the end of your antibiotic course

## 2024-08-02 NOTE — ED Provider Notes (Signed)
 [name]    CSN: [id number] Arrival date & time: 08/02/24  1023      History   Chief Complaint Chief Complaint  Patient presents with   Wound Check    HPI Mary Campos is a 38 y.o. female.   Patient presents to clinic for reevaluation of a wound to the abdomen that began 5 to 6 days ago.  Was evaluated in this urgent care 2 days ago by this provider, started on antibiotics.  He endorses yellow to green drainage present on her dressing.  Noticed an additional fluid-filled area on the abdomen beginning 1 day ago, has improved this morning.  Denies any fever.  Past Medical History:  Diagnosis Date   Asthma    albuteral inhaler PRN   Depression    hx mild ppd   History of pancreatitis    Vaginal Pap smear, abnormal     Patient Active Problem List   Diagnosis Date Noted   Wrist pain 04/29/2023   Postpartum depression 09/29/2020   History of bilateral tubal ligation 08/29/2020   Status post cesarean section 08/29/2020   Previous cesarean section 04/20/2020   Family history of spina bifida 02/27/2020   Supervision of high risk pregnancy, antepartum 02/24/2020   Cervical dysplasia 11/16/2019   LGSIL on Pap smear of cervix with negative HPV on 10/26/2019 10/26/2019   History of postpartum depression 04/11/2018   BMI 50.0-59.9, adult (HCC) 02/21/2018   Maternal obesity, antepartum 08/29/2017   History of gestational hypertension 08/26/2017   Cesarean delivery delivered 07/04/2016   Anxiety state 08/11/2010   Asthma 08/11/2010    Past Surgical History:  Procedure Laterality Date   CESAREAN SECTION     X 2   CESAREAN SECTION N/A 04/11/2018   Procedure: REPEAT CESAREAN SECTION;  Surgeon: Mary Harari, MD;  Location: [facility] BIRTHING SUITES;  Service: Obstetrics;  Laterality: N/A;   CESAREAN SECTION WITH BILATERAL TUBAL LIGATION Bilateral 07/03/2016   Procedure: CESAREAN SECTION/ Female 8lbs. 3oz;  Surgeon: Mary SHAUNNA Lesches, MD;  Location: [facility] ORS;   Service: Obstetrics;  Laterality: Bilateral;   CESAREAN SECTION WITH BILATERAL TUBAL LIGATION N/A 08/29/2020   Procedure: CESAREAN SECTION WITH BILATERAL TUBAL LIGATION;  Surgeon: Mary Harari, MD;  Location: [facility] OR[facility];  Service: Obstetrics;  Laterality: N/A;   CHOLECYSTECTOMY     COLPOSCOPY W/ BIOPSY / CURETTAGE  11/12/2019       TONSILLECTOMY      OB History     Gravida  5   Para  5   Term  5   Preterm  0   AB  0   Living  5      SAB  0   IAB  0   Ectopic  0   Multiple  0   Live Births  5            Home Medications    Prior to Admission medications   Medication Sig Start Date End Date Taking? Authorizing Provider  albuterol  (VENTOLIN  HFA) 108 (90 Base) MCG/ACT inhaler Inhale 2 puffs into the lungs every 4 (four) hours as needed for wheezing or shortness of breath. 04/20/20   Mary Suzen Octave, MD  cephALEXin [facility]) 250 MG/5ML suspension Take 10 mLs (500 mg total) by mouth 3 (three) times daily for 7 days. 07/31/24 08/07/24  Mary Shelba SAUNDERS, NP  citalopram (CELEXA) 10 MG tablet Take 10 mg by mouth daily.    [provider]  ibuprofen  (ADVIL ) 800 MG tablet Take  1 tablet (800 mg total) by mouth every 8 (eight) hours. 08/31/20   Mary Donnice HERO, MD  loratadine  (CLARITIN ) 10 MG tablet Take 10 mg by mouth at bedtime as needed (allergies (alternates between allegra, singulair  & claritin )).     [provider]  montelukast  (SINGULAIR ) 10 MG tablet Take 1 tablet (10 mg total) by mouth at bedtime as needed. 09/29/20   Mary Harari, MD  omeprazole (PRILOSEC) 40 MG capsule Take 40 mg by mouth daily.    [provider]    Family History Family History  Problem Relation Age of Onset   Hypertension Mother    Heart Problems Maternal Grandmother     Social History Social History   Tobacco Use   Smoking status: Never   Smokeless tobacco: Never  Vaping Use   Vaping status: Never Used  Substance Use Topics   Alcohol use:  No   Drug use: No     Allergies   Azithromycin, Eletriptan hydrobromide, Pseudoephedrine, and Tape   Review of Systems Review of Systems   Physical Exam Triage Vital Signs ED Triage Vitals  Encounter Vitals Group     BP 08/02/24 1032 113/80     Girls Systolic BP Percentile --      Girls Diastolic BP Percentile --      Boys Systolic BP Percentile --      Boys Diastolic BP Percentile --      Pulse Rate 08/02/24 1032 78     Resp 08/02/24 1032 20     Temp 08/02/24 1032 98.9 F (37.2 C)     Temp Source 08/02/24 1032 Oral     SpO2 08/02/24 1030 100 %     Weight --      Height --      Head Circumference --      Peak Flow --      Pain Score 08/02/24 1029 5     Pain Loc --      Pain Education --      Exclude from Growth Chart --    No data found.  Updated Vital Signs BP 113/80 (BP Location: Left Arm)   Pulse 78   Temp 98.9 F (37.2 C) (Oral)   Resp 20   LMP 06/01/2024 Comment: irregular peroids  SpO2 100%   Visual Acuity Right Eye Distance:   Left Eye Distance:   Bilateral Distance:    Right Eye Near:   Left Eye Near:    Bilateral Near:     Physical Exam Constitutional:      Appearance: Normal appearance.  Eyes:     Extraocular Movements: Extraocular movements intact.  Pulmonary:     Effort: Pulmonary effort is normal.  Skin:    Comments: Wound unchanged from initial presentation, deferred to photo on 10/10, no further abnormality to the abdomen  Neurological:     Mental Status: She is alert and oriented to person, place, and time. Mental status is at baseline.      UC Treatments / Results  Labs (all labs ordered are listed, but only abnormal results are displayed) Labs Reviewed  AEROBIC CULTURE W GRAM STAIN (SUPERFICIAL SPECIMEN)    EKG   Radiology No results found.  Procedures Procedures (including critical care time)  Medications Ordered in UC Medications - No data to display  Initial Impression / Assessment and Plan / UC Course  I  have reviewed the triage vital signs and the nursing notes.  Pertinent labs & imaging results that were available  during my care of the patient were reviewed by me and considered in my medical decision making (see chart for details).  Visit for wound check, open wound of abdomen  As wound has begun to drain able to obtain culture, pending, will change antibiotics based on results, discussed this with patient, area of concern has resolved most likely occurred due to gravity and position, advised to monitor, no further change in treatment plan at this time Final Clinical Impressions(s) / UC Diagnoses   Final diagnoses:  Visit for wound check  Open wound of abdomen, initial encounter     Discharge Instructions      Today you are evaluated for the wound, on initial evaluation there was pus present on the skin therefore area draining is normal  As it has begun to drain a sample has been obtained to ensure that you are on proper antibiotics, pending for up to 3 days and you will be notified if medicine needs to be changed  Continue taking antibiotic as directed  Clean over the area at least once daily with unscented soap and water , pat and do not rub, may cover with a nonstick dressing such as a Band-Aid while draining  Please schedule follow-up appointment with your primary doctor for recheck of your wound towards the end of your antibiotic course   ED Prescriptions   None    PDMP not reviewed this encounter.   Mary Shelba SAUNDERS, NP 08/02/24 1108

## 2024-08-02 NOTE — ED Triage Notes (Signed)
 Patient here for a wound check to left lower abdominal area. Patient states wound is now draining. Patient reports yellowish and greenish drainage. Patient was seen at [facility] for the same on 07/31/24. Patient was placed on antibiotics. Rates pain 5/10. Bruna

## 2024-08-03 ENCOUNTER — Ambulatory Visit

## 2024-08-04 ENCOUNTER — Ambulatory Visit (HOSPITAL_COMMUNITY): Payer: Self-pay

## 2024-08-04 LAB — AEROBIC CULTURE W GRAM STAIN (SUPERFICIAL SPECIMEN)
Culture: NO GROWTH
Gram Stain: NONE SEEN

## 2024-10-31 ENCOUNTER — Ambulatory Visit
Admission: EM | Admit: 2024-10-31 | Discharge: 2024-10-31 | Disposition: A | Discharge status: Home / Self Care | Attending: Emergency Medicine | Admitting: Emergency Medicine

## 2024-10-31 ENCOUNTER — Encounter: Payer: Self-pay | Admitting: Emergency Medicine

## 2024-10-31 DIAGNOSIS — H00015 Hordeolum externum left lower eyelid: Secondary | ICD-10-CM | POA: Diagnosis not present

## 2024-10-31 DIAGNOSIS — L03213 Periorbital cellulitis: Secondary | ICD-10-CM | POA: Diagnosis not present

## 2024-10-31 MED ORDER — AMOXICILLIN-POT CLAVULANATE 875-125 MG PO TABS: 1.0000 | ORAL_TABLET | Freq: Two times a day (BID) | ORAL | 0 refills | Status: AC

## 2024-10-31 NOTE — Discharge Instructions (Addendum)
 Take the Augmentin  as directed for the infection of the skin around your eye.    Follow-up with an eye care provider such as the one listed below.   Go to the emergency department if you have acute eye pain, changes in your vision, or other concerning symptoms.

## 2024-10-31 NOTE — ED Triage Notes (Signed)
 Patient in office today complaint of left lower eye lid issue tender, swelling,tearing, blurry vision. applied warm compress  x3 days. Also with Ha   OTC: none Denies: N/V and fever

## 2024-10-31 NOTE — ED Provider Notes (Signed)
 " Mary Campos    CSN: [id number] Arrival date & time: 10/31/24  1027      History   Chief Complaint Chief Complaint  Patient presents with   Eye Problem    HPI Mary Campos is a 39 y.o. female.  Patient presents with 3-day history of left upper and lower eyelid tenderness, swelling, redness.  She reports left eye tearing and blurry vision.  She also has a headache around her left eye.  No eye trauma, purulent drainage, fever, chills.  She has been treating her symptoms with warm compresses.  No OTC medications taken.  The history is provided by the patient and medical records.    Past Medical History:  Diagnosis Date   Asthma    albuteral inhaler PRN   Depression    hx mild ppd   History of pancreatitis    Vaginal Pap smear, abnormal     Patient Active Problem List   Diagnosis Date Noted   Wrist pain 04/29/2023   Postpartum depression 09/29/2020   History of bilateral tubal ligation 08/29/2020   Status post cesarean section 08/29/2020   Previous cesarean section 04/20/2020   Family history of spina bifida 02/27/2020   Supervision of high risk pregnancy, antepartum 02/24/2020   Cervical dysplasia 11/16/2019   LGSIL on Pap smear of cervix with negative HPV on 10/26/2019 10/26/2019   History of postpartum depression 04/11/2018   BMI 50.0-59.9, adult (HCC) 02/21/2018   Maternal obesity, antepartum 08/29/2017   History of gestational hypertension 08/26/2017   Cesarean delivery delivered 07/04/2016   Anxiety state 08/11/2010   Asthma 08/11/2010    Past Surgical History:  Procedure Laterality Date   CESAREAN SECTION     X 2   CESAREAN SECTION N/A 04/11/2018   Procedure: REPEAT CESAREAN SECTION;  Surgeon: Izell Harari, MD;  Location: [facility] BIRTHING SUITES;  Service: Obstetrics;  Laterality: N/A;   CESAREAN SECTION WITH BILATERAL TUBAL LIGATION Bilateral 07/03/2016   Procedure: CESAREAN SECTION/ Female 8lbs. 3oz;  Surgeon: Lamar SHAUNNA Lesches, MD;  Location:  [facility] ORS;  Service: Obstetrics;  Laterality: Bilateral;   CESAREAN SECTION WITH BILATERAL TUBAL LIGATION N/A 08/29/2020   Procedure: CESAREAN SECTION WITH BILATERAL TUBAL LIGATION;  Surgeon: Izell Harari, MD;  Location: [facility] LD ORS;  Service: Obstetrics;  Laterality: N/A;   CHOLECYSTECTOMY     COLPOSCOPY W/ BIOPSY / CURETTAGE  11/12/2019       TONSILLECTOMY      OB History     Gravida  5   Para  5   Term  5   Preterm  0   AB  0   Living  5      SAB  0   IAB  0   Ectopic  0   Multiple  0   Live Births  5            Home Medications    Prior to Admission medications  Medication Sig Start Date End Date Taking? Authorizing Provider  amoxicillin -clavulanate (AUGMENTIN ) 875-125 MG tablet Take 1 tablet by mouth every 12 (twelve) hours. 10/31/24  Yes Corlis Burnard DEL, NP  albuterol  (VENTOLIN  HFA) 108 (90 Base) MCG/ACT inhaler Inhale 2 puffs into the lungs every 4 (four) hours as needed for wheezing or shortness of breath. 04/20/20   Eldonna Suzen Octave, MD  citalopram (CELEXA) 10 MG tablet Take 10 mg by mouth daily.    [provider]  ibuprofen  (ADVIL ) 800 MG tablet Take 1 tablet (800 mg total)  by mouth every 8 (eight) hours. 08/31/20   Lola Donnice HERO, MD  loratadine  (CLARITIN ) 10 MG tablet Take 10 mg by mouth at bedtime as needed (allergies (alternates between allegra, singulair  & claritin )).     [provider]  montelukast  (SINGULAIR ) 10 MG tablet Take 1 tablet (10 mg total) by mouth at bedtime as needed. 09/29/20   Izell Harari, MD  omeprazole (PRILOSEC) 40 MG capsule Take 40 mg by mouth daily.    [provider]    Family History Family History  Problem Relation Age of Onset   Hypertension Mother    Heart Problems Maternal Grandmother     Social History Social History[1]   Allergies   Azithromycin, Eletriptan hydrobromide, Pseudoephedrine, and Tape   Review of Systems Review of Systems  Constitutional:  Negative for  chills and fever.  HENT:  Negative for ear pain and sore throat.   Eyes:  Positive for pain, redness and visual disturbance. Negative for discharge and itching.  Respiratory:  Negative for cough and shortness of breath.   Neurological:  Positive for headaches.     Physical Exam Triage Vital Signs ED Triage Vitals  Encounter Vitals Group     BP 10/31/24 1058 126/84     Girls Systolic BP Percentile --      Girls Diastolic BP Percentile --      Boys Systolic BP Percentile --      Boys Diastolic BP Percentile --      Pulse Rate 10/31/24 1058 79     Resp 10/31/24 1058 18     Temp 10/31/24 1058 98.6 F (37 C)     Temp Source 10/31/24 1058 Oral     SpO2 10/31/24 1058 98 %     Weight 10/31/24 1054 238 lb (108 kg)     Height 10/31/24 1054 5' 2 (1.575 m)     Head Circumference --      Peak Flow --      Pain Score 10/31/24 1053 5     Pain Loc --      Pain Education --      Exclude from Growth Chart --    No data found.  Updated Vital Signs BP 126/84   Pulse 79   Temp 98.6 F (37 C) (Oral)   Resp 18   Ht 5' 2 (1.575 m)   Wt 238 lb (108 kg)   LMP 10/07/2024   SpO2 98%   BMI 43.53 kg/m   Visual Acuity Right Eye Distance:   Left Eye Distance:   Bilateral Distance:    Right Eye Near: R Near: 20/13 Left Eye Near:  L Near: 20/13 Bilateral Near:  20/10  Physical Exam Constitutional:      General: She is not in acute distress. HENT:     Right Ear: Tympanic membrane normal.     Left Ear: Tympanic membrane normal.     Nose: Nose normal.     Mouth/Throat:     Mouth: Mucous membranes are moist.     Pharynx: Oropharynx is clear.  Eyes:     General: Vision grossly intact.        Right eye: No discharge.        Left eye: No discharge.     Extraocular Movements: Extraocular movements intact.     Conjunctiva/sclera: Conjunctivae normal.     Pupils: Pupils are equal, round, and reactive to light.      Comments: Mild generalized edema and erythema of left upper and  lower  eyelids.  Cardiovascular:     Rate and Rhythm: Normal rate and regular rhythm.     Heart sounds: Normal heart sounds.  Pulmonary:     Effort: Pulmonary effort is normal. No respiratory distress.     Breath sounds: Normal breath sounds.  Neurological:     Mental Status: She is alert.      UC Treatments / Results  Labs (all labs ordered are listed, but only abnormal results are displayed) Labs Reviewed - No data to display  EKG   Radiology No results found.  Procedures Procedures (including critical care time)  Medications Ordered in UC Medications - No data to display  Initial Impression / Assessment and Plan / UC Course  I have reviewed the triage vital signs and the nursing notes.  Pertinent labs & imaging results that were available during my care of the patient were reviewed by me and considered in my medical decision making (see chart for details).    Preseptal cellulitis of left eye, hordeolum of left lower eyelid.  Afebrile and vital signs were stable.  No eye trauma.  Vision intact.  Treating today with 7-day course of Augmentin .  Instructed patient to follow-up with an eye care provider on Monday.  Contact information for [facility] provided.  ED precautions discussed.  Education provided on preseptal cellulitis and stye.  Patient agrees to plan of care.  Final Clinical Impressions(s) / UC Diagnoses   Final diagnoses:  Preseptal cellulitis of left eye  Hordeolum externum of left lower eyelid     Discharge Instructions      Take the Augmentin  as directed for the infection of the skin around your eye.    Follow-up with an eye care provider such as the one listed below.   Go to the emergency department if you have acute eye pain, changes in your vision, or other concerning symptoms.        ED Prescriptions     Medication Sig Dispense Auth. Provider   amoxicillin -clavulanate (AUGMENTIN ) 875-125 MG tablet Take 1 tablet by mouth every 12  (twelve) hours. 14 tablet Corlis Burnard DEL, NP      PDMP not reviewed this encounter.    [1]  Social History Tobacco Use   Smoking status: Never   Smokeless tobacco: Never  Vaping Use   Vaping status: Never Used  Substance Use Topics   Alcohol use: No   Drug use: No     Corlis Burnard DEL, NP 10/31/24 1143  "

## 2024-11-01 ENCOUNTER — Ambulatory Visit

## 2024-11-26 ENCOUNTER — Ambulatory Visit

## 2024-11-26 DIAGNOSIS — L578 Other skin changes due to chronic exposure to nonionizing radiation: Secondary | ICD-10-CM | POA: Diagnosis not present

## 2024-11-26 DIAGNOSIS — W908XXA Exposure to other nonionizing radiation, initial encounter: Secondary | ICD-10-CM | POA: Diagnosis not present

## 2024-11-26 DIAGNOSIS — L814 Other melanin hyperpigmentation: Secondary | ICD-10-CM | POA: Diagnosis not present

## 2024-11-26 DIAGNOSIS — Z85828 Personal history of other malignant neoplasm of skin: Secondary | ICD-10-CM | POA: Diagnosis not present

## 2024-11-26 DIAGNOSIS — D1801 Hemangioma of skin and subcutaneous tissue: Secondary | ICD-10-CM

## 2024-11-26 DIAGNOSIS — D229 Melanocytic nevi, unspecified: Secondary | ICD-10-CM

## 2024-11-26 DIAGNOSIS — L821 Other seborrheic keratosis: Secondary | ICD-10-CM

## 2024-11-26 DIAGNOSIS — L820 Inflamed seborrheic keratosis: Secondary | ICD-10-CM | POA: Diagnosis not present

## 2024-11-26 NOTE — Progress Notes (Signed)
 "   Subjective   Mary Campos is a 39 y.o. female who presents for the following: Lesion(s) of concern . Patient is new patient  Today patient reports: LOC x2 on the nose LOC left ear  LOC bilateral lower extremities  Review of Systems:    No other skin or systemic complaints except as noted in HPI or Assessment and Plan.  The following portions of the chart were reviewed this encounter and updated as appropriate: medications, allergies, medical history  Relevant Medical History:  Personal history of non melanoma skin cancer - see medical history for full details   Objective  (SKPE) Well appearing patient in no apparent distress; mood and affect are within normal limits. Examination was performed of the: Focused Exam of: Bilateral thighs, face, left ear   Examination notable for: Lentigo/lentigines: Scattered pigmented macules that are tan to brown in color and are somewhat non-uniform in shape and concentrated in the sun-exposed areas, Nevus/nevi: Scattered well-demarcated, regular, pigmented macule(s) and/or papule(s)  , Seborrheic Keratosis(es): Stuck-on appearing keratotic papule(s) on the trunk, some  irritated with redness, crusting, edema, and/or partial avulsion, Actinic Damage/Elastosis: chronic sun damage: dyspigmentation, telangiectasia, and wrinkling  Examination limited by: Clothing and Patient deferred removal     Right Thigh x1, Left thigh x1 (2) Stuck on waxy paps with erythema  Assessment & Plan  (SKAP)   BENIGN SKIN FINDINGS  - Lentigines  - Seborrheic keratoses  - Nevus/Multiple Benign Nevi - Reassurance provided regarding the benign appearance of lesions noted on exam today; no treatment is indicated in the absence of symptoms/changes. - Reinforced importance of photoprotective strategies including liberal and frequent sunscreen use of a broad-spectrum SPF 30 or greater, use of protective clothing, and sun avoidance for prevention of cutaneous malignancy  and photoaging.  Counseled patient on the importance of regular self-skin monitoring as well as routine clinical skin examinations as scheduled.   ACTINIC DAMAGE - Chronic condition, secondary to cumulative UV/sun exposure - Recommend daily broad spectrum sunscreen SPF 30+ to sun-exposed areas, reapply every 2 hours as needed.  - Staying in the shade or wearing long sleeves, sun glasses (UVA+UVB protection) and wide brim hats (4-inch brim around the entire circumference of the hat) are also recommended for sun protection.  - Call for new or changing lesions.  Personal history of non melanoma skin cancer  SCC of forehead s/p MOHS 2024 at [facility]  - Reviewed medical history for full details  - Reviewed sun protective measures as above - Encouraged full body skin exams yearly - will schedule      Was sun protection counseling provided?: Yes   Level of service outlined above   Patient instructions (SKPI)   Procedures, orders, diagnosis for this visit:  INFLAMED SEBORRHEIC KERATOSIS (2) Right Thigh x1, Left thigh x1 (2) Symptomatic, irritating, patient would like treated. - Destruction of lesion - Right Thigh x1, Left thigh x1 (2) Complexity: simple   Destruction method: cryotherapy   Informed consent: discussed and consent obtained   Timeout:  patient name, date of birth, surgical site, and procedure verified Lesion destroyed using liquid nitrogen: Yes   Region frozen until ice ball extended beyond lesion: Yes   Cryo cycles: 1 or 2. Outcome: patient tolerated procedure well with no complications   Post-procedure details: wound care instructions given     Inflamed seborrheic keratosis -     Destruction of lesion    Return to clinic: Return in about 3 months (around 02/23/2025) for TBSE.  I, Emerick Ege, CMA am acting as scribe for Lauraine JAYSON Kanaris, MD.   Documentation: I have reviewed the above documentation for accuracy and completeness, and I agree with the  above.  Lauraine JAYSON Kanaris, MD  "

## 2024-11-26 NOTE — Patient Instructions (Signed)

## 2025-02-25 ENCOUNTER — Ambulatory Visit
